# Patient Record
Sex: Male | Born: 1960 | Race: Black or African American | Hispanic: No | Marital: Married | Smoking: Never smoker
Health system: Southern US, Community
[De-identification: ages and names within clinical notes are randomized; demographics above are authoritative.]

## PROBLEM LIST (undated history)

## (undated) DIAGNOSIS — I10 Essential (primary) hypertension: Secondary | ICD-10-CM

## (undated) DIAGNOSIS — E785 Hyperlipidemia, unspecified: Secondary | ICD-10-CM

## (undated) DIAGNOSIS — Z973 Presence of spectacles and contact lenses: Secondary | ICD-10-CM

## (undated) DIAGNOSIS — C61 Malignant neoplasm of prostate: Secondary | ICD-10-CM

## (undated) HISTORY — DX: Hyperlipidemia, unspecified: E78.5

## (undated) HISTORY — PX: NO PAST SURGERIES: SHX2092

## (undated) HISTORY — DX: Essential (primary) hypertension: I10

## (undated) HISTORY — PX: PROSTATE BIOPSY: SHX241

---

## 1998-06-20 ENCOUNTER — Emergency Department (HOSPITAL_COMMUNITY): Admission: EM | Admit: 1998-06-20 | Discharge: 1998-06-20 | Payer: Self-pay | Admitting: Emergency Medicine

## 1998-06-24 ENCOUNTER — Encounter: Admission: RE | Admit: 1998-06-24 | Discharge: 1998-09-22 | Payer: Self-pay | Admitting: Internal Medicine

## 2018-04-13 ENCOUNTER — Ambulatory Visit: Payer: Managed Care, Other (non HMO) | Admitting: Family Medicine

## 2018-04-13 ENCOUNTER — Encounter: Payer: Self-pay | Admitting: Family Medicine

## 2018-04-13 VITALS — BP 142/92 | HR 61 | Temp 98.7°F | Ht 68.5 in | Wt 187.4 lb

## 2018-04-13 DIAGNOSIS — R350 Frequency of micturition: Secondary | ICD-10-CM

## 2018-04-13 DIAGNOSIS — Z23 Encounter for immunization: Secondary | ICD-10-CM

## 2018-04-13 DIAGNOSIS — R030 Elevated blood-pressure reading, without diagnosis of hypertension: Secondary | ICD-10-CM | POA: Diagnosis not present

## 2018-04-13 NOTE — Addendum Note (Signed)
Addended by: Sharon Seller B on: 04/13/2018 08:17 AM   Modules accepted: Orders

## 2018-04-13 NOTE — Progress Notes (Signed)
Pre visit review using our clinic review tool, if applicable. No additional management support is needed unless otherwise documented below in the visit note. 

## 2018-04-13 NOTE — Progress Notes (Signed)
Chief Complaint  Patient presents with  . New Patient (Initial Visit)       New Patient Visit SUBJECTIVE: HPI: Danny Vincent is an 57 y.o.male who is being seen for establishing care.  The patient has not had PCP in many years.  10 d ago had freq urination. Was going every hour and then every 15 min. This lasted for around 2-3 days and then got back to normal. He believes he may have had too much [place] Ginseng and stopped taking it. No unintentional wt loss, abd pain, hematuria, dysuria, fevers, or constipation. +Famhx of prostate cancer. He was drinking some coffee when this happened and has cut down on it.   Does not remember last tetanus shot.  Has never been screened for Hep C or HIV.    No Known Allergies  Past Medical History:  Diagnosis Date  . Hypertension    Past Surgical History:  Procedure Laterality Date  . NO PAST SURGERIES     Family History  Problem Relation Age of Onset  . Diabetes Mother   . Hypertension Mother   . Diabetes Father   . Cancer Father 102       prostate cancer   No Known Allergies  Takes no meds routinely.  ROS GI: Denies constipation or abd pain  Respiratory: Denies dyspnea   OBJECTIVE: BP (!) 142/92 (BP Location: Left Arm, Patient Position: Sitting, Cuff Size: Large)   Pulse 61   Temp 98.7 F (37.1 C) (Oral)   Ht 5' 8.5" (1.74 m)   Wt 187 lb 6 oz (85 kg)   BMI 28.08 kg/m   Constitutional: -  VS reviewed -  Well developed, well nourished, appears stated age -  No apparent distress  Psychiatric: -  Oriented to person, place, and time -  Memory intact -  Affect and mood normal -  Fluent conversation, good eye contact -  Judgment and insight age appropriate  Eye: -  Conjunctivae clear, no discharge -  Pupils symmetric, round, reactive to light  ENMT: -  MMM    Pharynx moist, no exudate, no erythema  Neck: -  No gross swelling, no palpable masses -  Thyroid midline, not enlarged, mobile, no palpable masses   Cardiovascular: -  RRR -  No LE edema  Respiratory: -  Normal respiratory effort, no accessory muscle use, no retraction -  Breath sounds equal, no wheezes, no ronchi, no crackles  Gastrointestinal: -  Bowel sounds normal -  No tenderness, no distention, no guarding, no masses  Neurological:  -  CN II - XII grossly intact -  Sensation grossly intact to light touch, equal bilaterally  Musculoskeletal: -  No clubbing, no cyanosis -  Gait normal  Skin: -  No significant lesion on inspection -  Warm and dry to palpation   ASSESSMENT/PLAN: Urinary frequency  Elevated blood pressure reading  Offered labs, declined at this time since he is not currently having s/s's. Mind fluid, caffeine, alcohol intake. Let us know if new s/s's or current ones arise/return. Ck BP's at home. Counseled on diet and exercise. Bring readings and monitor to next visit. Patient should return in 1 mo for CPE/HTN check. The patient voiced understanding and agreement to the plan.   [place], DO 04/13/18  7:49 AM

## 2018-04-13 NOTE — Patient Instructions (Addendum)
Mind the caffeine and alcohol intake.   I think it is OK to go back on ginseng if you want.   If things come back sooner, let us know.  Come to your next appointment fasting.  Check your blood pressure 3 times per week and write them down. Bring your monitor and your readings to your appointment.  Stay active.  Keep the diet clean.  Let us know if you need anything.

## 2018-05-19 ENCOUNTER — Encounter: Payer: Self-pay | Admitting: Family Medicine

## 2018-05-19 ENCOUNTER — Ambulatory Visit (INDEPENDENT_AMBULATORY_CARE_PROVIDER_SITE_OTHER): Payer: Managed Care, Other (non HMO) | Admitting: Family Medicine

## 2018-05-19 VITALS — BP 144/100 | HR 63 | Temp 98.4°F | Ht 69.0 in | Wt 187.1 lb

## 2018-05-19 DIAGNOSIS — Z0000 Encounter for general adult medical examination without abnormal findings: Secondary | ICD-10-CM

## 2018-05-19 DIAGNOSIS — I10 Essential (primary) hypertension: Secondary | ICD-10-CM | POA: Insufficient documentation

## 2018-05-19 DIAGNOSIS — Z23 Encounter for immunization: Secondary | ICD-10-CM

## 2018-05-19 DIAGNOSIS — Z125 Encounter for screening for malignant neoplasm of prostate: Secondary | ICD-10-CM

## 2018-05-19 DIAGNOSIS — Z114 Encounter for screening for human immunodeficiency virus [HIV]: Secondary | ICD-10-CM | POA: Diagnosis not present

## 2018-05-19 DIAGNOSIS — Z1211 Encounter for screening for malignant neoplasm of colon: Secondary | ICD-10-CM

## 2018-05-19 DIAGNOSIS — Z1159 Encounter for screening for other viral diseases: Secondary | ICD-10-CM

## 2018-05-19 LAB — COMPREHENSIVE METABOLIC PANEL
ALT: 13 U/L (ref 0–53)
AST: 14 U/L (ref 0–37)
Albumin: 4.3 g/dL (ref 3.5–5.2)
Alkaline Phosphatase: 72 U/L (ref 39–117)
BUN: 12 mg/dL (ref 6–23)
CO2: 31 mEq/L (ref 19–32)
Calcium: 9.6 mg/dL (ref 8.4–10.5)
Chloride: 104 mEq/L (ref 96–112)
Creatinine, Ser: 1.04 mg/dL (ref 0.40–1.50)
GFR: 95.24 mL/min (ref >60.00)
GLUCOSE: 124 mg/dL — AB (ref 70–99)
POTASSIUM: 3.9 meq/L (ref 3.5–5.1)
SODIUM: 143 meq/L (ref 135–145)
TOTAL PROTEIN: 7.3 g/dL (ref 6.0–8.3)
Total Bilirubin: 1 mg/dL (ref 0.2–1.2)

## 2018-05-19 LAB — LIPID PANEL
CHOLESTEROL: 192 mg/dL (ref 0–200)
HDL: 48 mg/dL (ref >39.00)
LDL Cholesterol: 128 mg/dL — ABNORMAL HIGH (ref 0–99)
NonHDL: 144.45
Total CHOL/HDL Ratio: 4
Triglycerides: 80 mg/dL (ref 0.0–149.0)
VLDL: 16 mg/dL (ref 0.0–40.0)

## 2018-05-19 LAB — PSA: PSA: 6.42 ng/mL — AB (ref 0.10–4.00)

## 2018-05-19 MED ORDER — VALSARTAN 160 MG PO TABS: 160.0000 mg | ORAL_TABLET | Freq: Every day | ORAL | 3 refills | Status: DC

## 2018-05-19 NOTE — Progress Notes (Signed)
Pre visit review using our clinic review tool, if applicable. No additional management support is needed unless otherwise documented below in the visit note. 

## 2018-05-19 NOTE — Patient Instructions (Addendum)
Continue checking BP at home.  Keep the diet clean and stay active.  Clotrimazole cream over the counter, twice daily for 2 weeks.  Give [place] 2-3 business days to get the results of your labs back.  If you do not hear anything about your referral in the next 1-2 weeks, call our office and ask for an update.  Let us know if you need anything.   DASH Eating Plan DASH stands for "Dietary Approaches to Stop Hypertension." The DASH eating plan is a healthy eating plan that has been shown to reduce high blood pressure (hypertension). It may also reduce your risk for type 2 diabetes, heart disease, and stroke. The DASH eating plan may also help with weight loss. What are tips for following this plan? General guidelines  Avoid eating more than 2,300 mg (milligrams) of salt (sodium) a day. If you have hypertension, you may need to reduce your sodium intake to 1,500 mg a day.  Limit alcohol intake to no more than 1 drink a day for nonpregnant women and 2 drinks a day for men. One drink equals 12 oz of beer, 5 oz of wine, or 1 oz of hard liquor.  Work with your health care provider to maintain a healthy body weight or to lose weight. Ask what an ideal weight is for you.  Get at least 30 minutes of exercise that causes your heart to beat faster (aerobic exercise) most days of the week. Activities may include walking, swimming, or biking.  Work with your health care provider or diet and nutrition specialist (dietitian) to adjust your eating plan to your individual calorie needs. Reading food labels  Check food labels for the amount of sodium per serving. Choose foods with less than 5 percent of the Daily Value of sodium. Generally, foods with less than 300 mg of sodium per serving fit into this eating plan.  To find whole grains, look for the word "whole" as the first word in the ingredient list. Shopping  Buy products labeled as "low-sodium" or "no salt added."  Buy fresh foods. Avoid canned  foods and premade or frozen meals. Cooking  Avoid adding salt when cooking. Use salt-free seasonings or herbs instead of table salt or sea salt. Check with your health care provider or pharmacist before using salt substitutes.  Do not fry foods. Cook foods using healthy methods such as baking, boiling, grilling, and broiling instead.  Cook with heart-healthy oils, such as olive, canola, soybean, or sunflower oil. Meal planning   Eat a balanced diet that includes: ? 5 or more servings of fruits and vegetables each day. At each meal, try to fill half of your plate with fruits and vegetables. ? Up to 6-8 servings of whole grains each day. ? Less than 6 oz of lean meat, poultry, or fish each day. A 3-oz serving of meat is about the same size as a deck of cards. One egg equals 1 oz. ? 2 servings of low-fat dairy each day. ? A serving of nuts, seeds, or beans 5 times each week. ? Heart-healthy fats. Healthy fats called Omega-3 fatty acids are found in foods such as flaxseeds and coldwater fish, like sardines, salmon, and mackerel.  Limit how much you eat of the following: ? Canned or prepackaged foods. ? Food that is high in trans fat, such as fried foods. ? Food that is high in saturated fat, such as fatty meat. ? Sweets, desserts, sugary drinks, and other foods with added sugar. ? Full-fat  dairy products.  Do not salt foods before eating.  Try to eat at least 2 vegetarian meals each week.  Eat more home-cooked food and less restaurant, buffet, and fast food.  When eating at a restaurant, ask that your food be prepared with less salt or no salt, if possible. What foods are recommended? The items listed may not be a complete list. Talk with your dietitian about what dietary choices are best for you. Grains Whole-grain or whole-wheat bread. Whole-grain or whole-wheat pasta. Brown rice. [place]. Bulgur. Whole-grain and low-sodium cereals. Pita bread. Low-fat, low-sodium crackers.  Whole-wheat flour tortillas. Vegetables Fresh or frozen vegetables (raw, steamed, roasted, or grilled). Low-sodium or reduced-sodium tomato and vegetable juice. Low-sodium or reduced-sodium tomato sauce and tomato paste. Low-sodium or reduced-sodium canned vegetables. Fruits All fresh, dried, or frozen fruit. Canned fruit in natural juice (without added sugar). Meat and other protein foods Skinless chicken or [place]. Ground chicken or [place]. Pork with fat trimmed off. Fish and seafood. Egg whites. Dried beans, peas, or lentils. Unsalted nuts, nut butters, and seeds. Unsalted canned beans. Lean cuts of beef with fat trimmed off. Low-sodium, lean deli meat. Dairy Low-fat (1%) or fat-free (skim) milk. Fat-free, low-fat, or reduced-fat cheeses. Nonfat, low-sodium ricotta or cottage cheese. Low-fat or nonfat yogurt. Low-fat, low-sodium cheese. Fats and oils Soft margarine without trans fats. Vegetable oil. Low-fat, reduced-fat, or light mayonnaise and salad dressings (reduced-sodium). Canola, safflower, olive, soybean, and sunflower oils. Avocado. Seasoning and other foods Herbs. Spices. Seasoning mixes without salt. Unsalted popcorn and pretzels. Fat-free sweets. What foods are not recommended? The items listed may not be a complete list. Talk with your dietitian about what dietary choices are best for you. Grains Baked goods made with fat, such as croissants, muffins, or some breads. Dry pasta or rice meal packs. Vegetables Creamed or fried vegetables. Vegetables in a cheese sauce. Regular canned vegetables (not low-sodium or reduced-sodium). Regular canned tomato sauce and paste (not low-sodium or reduced-sodium). Regular tomato and vegetable juice (not low-sodium or reduced-sodium). Angie Fava. Olives. Fruits Canned fruit in a light or heavy syrup. Fried fruit. Fruit in cream or butter sauce. Meat and other protein foods Fatty cuts of meat. Ribs. Fried meat. Berniece Salines. Sausage. Bologna and other  processed lunch meats. Salami. Fatback. Hotdogs. Bratwurst. Salted nuts and seeds. Canned beans with added salt. Canned or smoked fish. Whole eggs or egg yolks. Chicken or [place] with skin. Dairy Whole or 2% milk, cream, and half-and-half. Whole or full-fat cream cheese. Whole-fat or sweetened yogurt. Full-fat cheese. Nondairy creamers. Whipped toppings. Processed cheese and cheese spreads. Fats and oils Butter. Stick margarine. Lard. Shortening. Ghee. Bacon fat. Tropical oils, such as coconut, palm kernel, or palm oil. Seasoning and other foods Salted popcorn and pretzels. Onion salt, garlic salt, seasoned salt, table salt, and sea salt. Worcestershire sauce. Tartar sauce. Barbecue sauce. Teriyaki sauce. Soy sauce, including reduced-sodium. Steak sauce. Canned and packaged gravies. Fish sauce. Oyster sauce. Cocktail sauce. Horseradish that you find on the shelf. Ketchup. Mustard. Meat flavorings and tenderizers. Bouillon cubes. Hot sauce and Tabasco sauce. Premade or packaged marinades. Premade or packaged taco seasonings. Relishes. Regular salad dressings. Where to find more information:  National Heart, Lung, and [place]: https://wilson-eaton.com/  American Heart Association: www.heart.org Summary  The DASH eating plan is a healthy eating plan that has been shown to reduce high blood pressure (hypertension). It may also reduce your risk for type 2 diabetes, heart disease, and stroke.  With the DASH eating plan, you should  limit salt (sodium) intake to 2,300 mg a day. If you have hypertension, you may need to reduce your sodium intake to 1,500 mg a day.  When on the DASH eating plan, aim to eat more fresh fruits and vegetables, whole grains, lean proteins, low-fat dairy, and heart-healthy fats.  Work with your health care provider or diet and nutrition specialist (dietitian) to adjust your eating plan to your individual calorie needs. This information is not intended to replace advice given to  you by your health care provider. Make sure you discuss any questions you have with your health care provider. Document Released: 08/27/2011 Document Revised: 08/31/2016 Document Reviewed: 08/31/2016 Elsevier Interactive Patient Education  [employer].

## 2018-05-19 NOTE — Addendum Note (Signed)
Addended by: Sharon Seller B on: 05/19/2018 08:49 AM   Modules accepted: Orders

## 2018-05-19 NOTE — Progress Notes (Signed)
Chief Complaint  Patient presents with  . Annual Exam    Well Male Danny Vincent is here for a complete physical.   His last physical was >1 year ago.  Current diet: in general, a "not really healthy" diet.  Current exercise: physically active at work; [employer] 3x/week lifting wts Weight trend: stable Seat belt? Yes.     Health maintenance Shingrix- No Colonoscopy- No Tetanus- Yes HIV- No Hep C- No Prostate cancer screening- No   Past Medical History:  Diagnosis Date  . Hypertension       Past Surgical History:  Procedure Laterality Date  . NO PAST SURGERIES      Medications  Takes no meds routinely.   Allergies No Known Allergies  Family History Family History  Problem Relation Age of Onset  . Diabetes Mother   . Hypertension Mother   . Diabetes Father   . Cancer Father 56       prostate cancer    Review of Systems: Constitutional:  no fevers Eye:  no recent significant change in vision Ear/Nose/Mouth/Throat:  Ears:  no hearing loss Nose/Mouth/Throat:  no complaints of nasal congestion, no sore throat Cardiovascular:  no chest pain, no palpitations Respiratory:  no cough and no shortness of breath Gastrointestinal:  no abdominal pain, no change in bowel habits GU:  Male: negative for dysuria, frequency, and incontinence and negative for prostate symptoms Musculoskeletal/Extremities:  no pain, redness, or swelling of the joints Integumentary (Skin/Breast):  no abnormal skin lesions reported Neurologic:  no headaches Endocrine: No unexpected weight changes Hematologic/Lymphatic:  no abnormal bleeding  Exam BP (!) 144/100 (BP Location: Left Arm, Patient Position: Sitting, Cuff Size: Normal)   Pulse 63   Temp 98.4 F (36.9 C) (Oral)   Ht 5\' 9"  (1.753 m)   Wt 187 lb 2 oz (84.9 kg)   SpO2 98%   BMI 27.63 kg/m  General:  well developed, well nourished, in no apparent distress Skin: Hyperpigmentation with central clearing in inguinal  region; otherwise no significant moles, warts, or growths Head:  no masses, lesions, or tenderness Eyes:  pupils equal and round, sclera anicteric without injection Ears:  canals without lesions, TMs shiny without retraction, no obvious effusion, no erythema Nose:  nares patent, septum midline, mucosa normal Throat/Pharynx:  lips and gingiva without lesion; tongue and uvula midline; non-inflamed pharynx; no exudates or postnasal drainage Neck: neck supple without adenopathy, thyromegaly, or masses Lungs:  clear to auscultation, breath sounds equal bilaterally, no respiratory distress Cardio:  regular rate and rhythm, no LE edema, no bruits Abdomen:  abdomen soft, nontender; bowel sounds normal; no masses or organomegaly Genital (male): circumcised penis, no lesions or discharge; testes present bilaterally without masses or tenderness Rectal: Deferred Musculoskeletal:  symmetrical muscle groups noted without atrophy or deformity Extremities:  no clubbing, cyanosis, or edema, no deformities, no skin discoloration Neuro:  gait normal; deep tendon reflexes normal and symmetric Psych: well oriented with flat affect and appropriate judgment/insight  Assessment and Plan  Well adult exam - Plan: Comprehensive metabolic panel, Lipid panel  Screening for HIV (human immunodeficiency virus) - Plan: HIV antibody  Encounter for hepatitis C screening test for low risk patient - Plan: Hepatitis C antibody  Screening for prostate cancer - Plan: PSA  Screen for colon cancer - Plan: Ambulatory referral to Gastroenterology  Essential hypertension - Plan: valsartan (DIOVAN) 160 MG tablet   Well 57 y.o. male. Counseled on diet and exercise. Counseled on risks and benefits of prostate  cancer screening with PSA. The patient agrees to undergo testing. Immunizations, labs, and further orders as above. Start ARB, will recheck lytes/renal function at f/u. Cont checking BP at home.  OTC clotrimazole for jock  itch. Follow up 1 mo. The patient voiced understanding and agreement to the plan.  [place], DO 05/19/18 8:38 AM

## 2018-05-20 ENCOUNTER — Other Ambulatory Visit: Payer: Self-pay | Admitting: Family Medicine

## 2018-05-20 DIAGNOSIS — R739 Hyperglycemia, unspecified: Secondary | ICD-10-CM

## 2018-05-20 DIAGNOSIS — R972 Elevated prostate specific antigen [PSA]: Secondary | ICD-10-CM

## 2018-05-20 LAB — HIV ANTIBODY (ROUTINE TESTING W REFLEX): HIV 1&2 Ab, 4th Generation: NONREACTIVE

## 2018-05-20 LAB — HEPATITIS C ANTIBODY
Hepatitis C Ab: NONREACTIVE
SIGNAL TO CUT-OFF: 0.03 (ref <1.00)

## 2018-06-20 ENCOUNTER — Encounter: Payer: Self-pay | Admitting: Family Medicine

## 2018-06-23 ENCOUNTER — Ambulatory Visit: Payer: Managed Care, Other (non HMO) | Admitting: Family Medicine

## 2018-06-23 ENCOUNTER — Encounter: Payer: Self-pay | Admitting: Family Medicine

## 2018-06-23 VITALS — BP 140/86 | HR 50 | Temp 98.3°F | Ht 69.0 in | Wt 187.2 lb

## 2018-06-23 DIAGNOSIS — I10 Essential (primary) hypertension: Secondary | ICD-10-CM

## 2018-06-23 MED ORDER — VALSARTAN 320 MG PO TABS: 320.0000 mg | ORAL_TABLET | Freq: Every day | ORAL | 2 refills | Status: DC

## 2018-06-23 MED ORDER — VALSARTAN 160 MG PO TABS: 160.0000 mg | ORAL_TABLET | Freq: Every day | ORAL | 3 refills | Status: DC

## 2018-06-23 NOTE — Patient Instructions (Addendum)
Keep checking blood pressures.   Keep the diet clean.  I love that you are adding cardio to your regimen.   I think we are very close to being where we want to be.   Call the gastroenterologist to get set up with your colonoscopy.  Let us know if you need anything.

## 2018-06-23 NOTE — Progress Notes (Signed)
Pre visit review using our clinic review tool, if applicable. No additional management support is needed unless otherwise documented below in the visit note. 

## 2018-06-23 NOTE — Progress Notes (Signed)
Chief Complaint  Patient presents with  . Follow-up    Subjective Danny Vincent is a 57 y.o. male who presents for hypertension follow up. He does monitor home blood pressures. Blood pressures ranging from 140's/80's on average. He is compliant with medication- valsartan 160 mg/d. Patient has these side effects of medication: none He is adhering to a healthy diet overall. Current exercise: lift weights, active at work   Past Medical History:  Diagnosis Date  . Hypertension     Review of Systems Cardiovascular: no chest pain Respiratory:  no shortness of breath  Exam BP 140/86 (BP Location: Left Arm, Patient Position: Sitting, Cuff Size: Normal)   Pulse (!) 50   Temp 98.3 F (36.8 C) (Oral)   Ht 5\' 9"  (1.753 m)   Wt 187 lb 4 oz (84.9 kg)   SpO2 97%   BMI 27.65 kg/m  General:  well developed, well nourished, in no apparent distress Heart: RRR, no bruits, no LE edema Lungs: clear to auscultation, no accessory muscle use Psych: well oriented with normal range of affect and appropriate judgment/insight  Essential hypertension - Plan: valsartan (DIOVAN) 320 MG tablet  Increase dose of Valsartan from 160 mg/d to 320 mg/d. Cont ck'ing BP at home. Counseled on diet and exercise. F/u in 1 mo. The patient voiced understanding and agreement to the plan.  [place], DO 06/23/18  8:32 AM

## 2018-07-25 ENCOUNTER — Ambulatory Visit: Payer: Managed Care, Other (non HMO) | Admitting: Family Medicine

## 2018-08-05 ENCOUNTER — Encounter: Payer: Self-pay | Admitting: Family Medicine

## 2018-08-05 ENCOUNTER — Ambulatory Visit: Payer: Managed Care, Other (non HMO) | Admitting: Family Medicine

## 2018-08-05 VITALS — BP 152/100 | HR 60 | Temp 98.7°F | Ht 69.0 in | Wt 190.0 lb

## 2018-08-05 DIAGNOSIS — I10 Essential (primary) hypertension: Secondary | ICD-10-CM

## 2018-08-05 MED ORDER — AMLODIPINE BESYLATE-VALSARTAN 5-320 MG PO TABS: 1.0000 | ORAL_TABLET | Freq: Every day | ORAL | 2 refills | Status: DC

## 2018-08-05 NOTE — Progress Notes (Signed)
Chief Complaint  Patient presents with  . Follow-up    Subjective Danny Vincent is a 57 y.o. male who presents for hypertension follow up. He does monitor home blood pressures. Blood pressures ranging from 150's/100's on average. He is compliant with medications. Patient has these side effects of medication: none He is adhering to a healthy diet overall. Current exercise: none   Past Medical History:  Diagnosis Date  . Hypertension     Review of Systems Cardiovascular: no chest pain Respiratory:  no shortness of breath  Exam BP (!) 152/100 (BP Location: Left Arm, Patient Position: Sitting, Cuff Size: Normal)   Pulse 60   Temp 98.7 F (37.1 C) (Oral)   Ht 5\' 9"  (1.753 m)   Wt 190 lb (86.2 kg)   SpO2 96%   BMI 28.06 kg/m  General:  well developed, well nourished, in no apparent distress Heart: RRR, no bruits, no LE edema Lungs: clear to auscultation, no accessory muscle use Psych: well oriented with normal range of affect and appropriate judgment/insight  Essential hypertension - Plan: amLODipine-valsartan (EXFORGE) 5-320 MG tablet  Orders as above. Add Norvasc to Valsartan. Counseled on diet and exercise. Cont ck'ing BP at home. F/u in 1 mo. The patient voiced understanding and agreement to the plan.  [place], DO 08/05/18  8:58 AM

## 2018-08-05 NOTE — Progress Notes (Signed)
Pre visit review using our clinic review tool, if applicable. No additional management support is needed unless otherwise documented below in the visit note. 

## 2018-08-05 NOTE — Patient Instructions (Addendum)
Continue checking your blood pressure at home.  Aim to do some physical exertion for 150 minutes per week. This is typically divided into 5 days per week, 30 minutes per day. The activity should be enough to get your heart rate up. Anything is better than nothing if you have time constraints.  Keep the diet clean.   Let me know if the medicine is too expensive. I can call them in separately at an affordable cost.  Let us know if you need anything.

## 2018-09-02 ENCOUNTER — Ambulatory Visit: Payer: Managed Care, Other (non HMO) | Admitting: Family Medicine

## 2018-09-05 ENCOUNTER — Encounter: Payer: Self-pay | Admitting: Family Medicine

## 2018-09-05 ENCOUNTER — Ambulatory Visit: Payer: Managed Care, Other (non HMO) | Admitting: Family Medicine

## 2018-09-05 VITALS — BP 130/80 | HR 60 | Temp 98.4°F | Ht 69.0 in | Wt 194.2 lb

## 2018-09-05 DIAGNOSIS — I10 Essential (primary) hypertension: Secondary | ICD-10-CM | POA: Diagnosis not present

## 2018-09-05 NOTE — Patient Instructions (Signed)
Call the GI team to set up your colonoscopy.  Because your blood pressure is well-controlled, you no longer have to check your blood pressure at home anymore unless you wish. Some people check it twice daily every day and some people stop altogether. Either or anything in between is fine. Strong work!  Keep the diet clean and stay active.  Aim to do some physical exertion for 150 minutes per week. This is typically divided into 5 days per week, 30 minutes per day. The activity should be enough to get your heart rate up. Anything is better than nothing if you have time constraints.  Let us know if you need anything.

## 2018-09-05 NOTE — Progress Notes (Signed)
Chief Complaint  Patient presents with  . Follow-up    hypertension    Subjective Danny Vincent is a 57 y.o. male who presents for hypertension follow up. He does monitor home blood pressures. Blood pressures ranging from 130's/80's on average. He is compliant with medications- Exforge 5-320 mg/d. Patient has these side effects of medication: none He is adhering to a healthy diet overall. Current exercise: none   Past Medical History:  Diagnosis Date  . Hypertension     Review of Systems Cardiovascular: no chest pain Respiratory:  no shortness of breath  Exam BP 130/80 (BP Location: Left Arm, Patient Position: Sitting, Cuff Size: Normal)   Pulse 60   Temp 98.4 F (36.9 C) (Oral)   Ht 5\' 9"  (1.753 m)   Wt 194 lb 4 oz (88.1 kg)   SpO2 97%   BMI 28.69 kg/m  General:  well developed, well nourished, in no apparent distress Heart: RRR, no bruits, no LE edema Lungs: clear to auscultation, no accessory muscle use Psych: well oriented with normal range of affect and appropriate judgment/insight  Essential hypertension  Cont Exforge 5-320. OK to stop ck'ing BP at home.  Counseled on diet and exercise. Start exercising. He was encouraged to call GI office for his colonoscopy. F/u in 3 mo, if still controlled every 6 mo after that. The patient voiced understanding and agreement to the plan.  [place], DO 09/05/18  7:13 AM

## 2018-11-04 ENCOUNTER — Other Ambulatory Visit: Payer: Self-pay | Admitting: Family Medicine

## 2018-11-04 DIAGNOSIS — I10 Essential (primary) hypertension: Secondary | ICD-10-CM

## 2018-12-05 ENCOUNTER — Ambulatory Visit: Payer: Managed Care, Other (non HMO) | Admitting: Family Medicine

## 2018-12-19 ENCOUNTER — Ambulatory Visit: Payer: Managed Care, Other (non HMO) | Admitting: Family Medicine

## 2019-02-07 ENCOUNTER — Other Ambulatory Visit: Payer: Self-pay | Admitting: Family Medicine

## 2019-02-07 DIAGNOSIS — I10 Essential (primary) hypertension: Secondary | ICD-10-CM

## 2019-05-09 ENCOUNTER — Other Ambulatory Visit: Payer: Self-pay | Admitting: Family Medicine

## 2019-05-09 DIAGNOSIS — I10 Essential (primary) hypertension: Secondary | ICD-10-CM

## 2019-05-09 MED ORDER — AMLODIPINE BESYLATE-VALSARTAN 5-320 MG PO TABS: 1.0000 | ORAL_TABLET | Freq: Every day | ORAL | 5 refills | Status: DC

## 2020-03-16 ENCOUNTER — Ambulatory Visit: Payer: Managed Care, Other (non HMO) | Attending: Internal Medicine

## 2020-03-16 DIAGNOSIS — Z23 Encounter for immunization: Secondary | ICD-10-CM

## 2020-03-16 NOTE — Progress Notes (Signed)
   Covid-19 Vaccination Clinic  Name:  Danny Vincent    MRN: [id number] DOB: Dec 19, 1960  03/16/2020  [name] was observed post Covid-19 immunization for 15 minutes without incident. He was provided with Vaccine Information Sheet and instruction to access the V-Safe system.   [name] was instructed to call 911 with any severe reactions post vaccine: Marland Kitchen Difficulty breathing  . Swelling of face and throat  . A fast heartbeat  . A bad rash all over body  . Dizziness and weakness   Immunizations Administered    Name Date Dose VIS Date Route   Pfizer COVID-19 Vaccine 03/16/2020  8:55 AM 0.3 mL 11/15/2018 Intramuscular   Manufacturer: [place]   Lot: [id number]   [place]: [id number]

## 2020-03-29 ENCOUNTER — Ambulatory Visit (INDEPENDENT_AMBULATORY_CARE_PROVIDER_SITE_OTHER): Payer: Managed Care, Other (non HMO) | Admitting: Family Medicine

## 2020-03-29 ENCOUNTER — Other Ambulatory Visit (INDEPENDENT_AMBULATORY_CARE_PROVIDER_SITE_OTHER): Payer: Managed Care, Other (non HMO)

## 2020-03-29 ENCOUNTER — Other Ambulatory Visit: Payer: Self-pay

## 2020-03-29 ENCOUNTER — Encounter: Payer: Self-pay | Admitting: Family Medicine

## 2020-03-29 ENCOUNTER — Other Ambulatory Visit: Payer: Self-pay | Admitting: Family Medicine

## 2020-03-29 VITALS — BP 138/86 | HR 53 | Temp 98.2°F | Ht 68.5 in | Wt 199.2 lb

## 2020-03-29 DIAGNOSIS — I10 Essential (primary) hypertension: Secondary | ICD-10-CM

## 2020-03-29 DIAGNOSIS — Z125 Encounter for screening for malignant neoplasm of prostate: Secondary | ICD-10-CM | POA: Diagnosis not present

## 2020-03-29 DIAGNOSIS — R739 Hyperglycemia, unspecified: Secondary | ICD-10-CM

## 2020-03-29 DIAGNOSIS — Z0000 Encounter for general adult medical examination without abnormal findings: Secondary | ICD-10-CM | POA: Diagnosis not present

## 2020-03-29 DIAGNOSIS — R972 Elevated prostate specific antigen [PSA]: Secondary | ICD-10-CM

## 2020-03-29 DIAGNOSIS — Z1211 Encounter for screening for malignant neoplasm of colon: Secondary | ICD-10-CM | POA: Diagnosis not present

## 2020-03-29 LAB — CBC
HCT: 42.4 % (ref 39.0–52.0)
Hemoglobin: 13.5 g/dL (ref 13.0–17.0)
MCHC: 31.8 g/dL (ref 30.0–36.0)
MCV: 83.8 fl (ref 78.0–100.0)
Platelets: 291 10*3/uL (ref 150.0–400.0)
RBC: 5.05 Mil/uL (ref 4.22–5.81)
RDW: 15.3 % (ref 11.5–15.5)
WBC: 8.6 10*3/uL (ref 4.0–10.5)

## 2020-03-29 LAB — COMPREHENSIVE METABOLIC PANEL
ALT: 17 U/L (ref 0–53)
AST: 16 U/L (ref 0–37)
Albumin: 4.6 g/dL (ref 3.5–5.2)
Alkaline Phosphatase: 78 U/L (ref 39–117)
BUN: 13 mg/dL (ref 6–23)
CO2: 28 mEq/L (ref 19–32)
Calcium: 9.7 mg/dL (ref 8.4–10.5)
Chloride: 105 mEq/L (ref 96–112)
Creatinine, Ser: 1.07 mg/dL (ref 0.40–1.50)
GFR: 86.14 mL/min (ref >60.00)
Glucose, Bld: 139 mg/dL — ABNORMAL HIGH (ref 70–99)
Potassium: 4 mEq/L (ref 3.5–5.1)
Sodium: 142 mEq/L (ref 135–145)
Total Bilirubin: 0.8 mg/dL (ref 0.2–1.2)
Total Protein: 7.3 g/dL (ref 6.0–8.3)

## 2020-03-29 LAB — HEMOGLOBIN A1C: Hgb A1c MFr Bld: 6.5 % (ref 4.6–6.5)

## 2020-03-29 LAB — LIPID PANEL
Cholesterol: 203 mg/dL — ABNORMAL HIGH (ref 0–200)
HDL: 45.2 mg/dL (ref >39.00)
LDL Cholesterol: 138 mg/dL — ABNORMAL HIGH (ref 0–99)
NonHDL: 157.94
Total CHOL/HDL Ratio: 4
Triglycerides: 101 mg/dL (ref 0.0–149.0)
VLDL: 20.2 mg/dL (ref 0.0–40.0)

## 2020-03-29 LAB — PSA: PSA: 8.78 ng/mL — ABNORMAL HIGH (ref 0.10–4.00)

## 2020-03-29 MED ORDER — AMLODIPINE BESYLATE-VALSARTAN 5-320 MG PO TABS: 1.0000 | ORAL_TABLET | Freq: Every day | ORAL | 2 refills | Status: DC

## 2020-03-29 NOTE — Patient Instructions (Addendum)
Give [place] 2-3 business days to get the results of your labs back.   Keep the diet clean and stay active.  OK to use Debrox (peroxide) in the ear to loosen up wax. Also recommend using a bulb syringe (for removing boogers from baby's noses) to flush through warm water. Do not use Q-tips as this can impact wax further.  If you do not hear anything about your referral in the next 1-2 weeks, call our office and ask for an update.  Let us know if you need anything.

## 2020-03-29 NOTE — Progress Notes (Signed)
Chief Complaint  Patient presents with   Annual Exam    Well Male Danny Vincent is here for a complete physical.   His last physical was >1 year ago.  Current diet: in general, diet could be better.  Current exercise: cycling, walking Weight trend: stable Fatigue? No. Seat belt? Yes.    Health maintenance Shingrix- Needs 2nd dose Colonoscopy- No Tetanus- Yes HIV- Yes Hep C- Yes   Past Medical History:  Diagnosis Date   Hypertension       Past Surgical History:  Procedure Laterality Date   NO PAST SURGERIES      Medications  No current outpatient medications on file prior to visit.   No current facility-administered medications on file prior to visit.     Allergies No Known Allergies  Family History Family History  Problem Relation Age of Onset   Diabetes Mother    Hypertension Mother    Diabetes Father    Cancer Father 43       prostate cancer    Review of Systems: Constitutional:  no fevers Eye:  no recent significant change in vision Ear/Nose/Mouth/Throat:  Ears:  no hearing loss Nose/Mouth/Throat:  no complaints of nasal congestion, no sore throat Cardiovascular:  no chest pain, no palpitations Respiratory:  no cough and no shortness of breath Gastrointestinal:  no abdominal pain, no change in bowel habits GU:  Male: negative for dysuria, frequency, and incontinence and negative for prostate symptoms Musculoskeletal/Extremities:  no pain, redness, or swelling of the joints Integumentary (Skin/Breast):  no abnormal skin lesions reported Neurologic:  no headaches Endocrine: No unexpected weight changes Hematologic/Lymphatic:  no abnormal bleeding  Exam BP 138/86 (BP Location: Left Arm, Patient Position: Sitting, Cuff Size: Normal)    Pulse (!) 53    Temp 98.2 F (36.8 C) (Oral)    Ht 5' 8.5" (1.74 m)    Wt 199 lb 4 oz (90.4 kg)    SpO2 99%    BMI 29.86 kg/m  General:  well developed, well nourished, in no apparent distress Skin:  no  significant moles, warts, or growths Head:  no masses, lesions, or tenderness Eyes:  pupils equal and round, sclera anicteric without injection Ears:  canals without lesions, TMs shiny without retraction, no obvious effusion, no erythema Nose:  nares patent, septum midline, mucosa normal Throat/Pharynx:  lips and gingiva without lesion; tongue and uvula midline; non-inflamed pharynx; no exudates or postnasal drainage Neck: neck supple without adenopathy, thyromegaly, or masses Cardiac: RRR, no bruits, no LE edema Lungs:  clear to auscultation, breath sounds equal bilaterally, no respiratory distress Rectal: Deferred Musculoskeletal:  symmetrical muscle groups noted without atrophy or deformity Neuro:  gait normal; deep tendon reflexes normal and symmetric Psych: well oriented with normal range of affect and appropriate judgment/insight  Assessment and Plan  Essential hypertension - Plan: amLODipine-valsartan (EXFORGE) 5-320 MG tablet  Well adult exam - Plan: CBC, Comprehensive metabolic panel, Lipid panel  Screen for colon cancer - Plan: Ambulatory referral to Gastroenterology  Screening for malignant neoplasm of prostate - Plan: PSA   Well 59 y.o. male. Counseled on diet and exercise. Counseled on risks and benefits of prostate cancer screening with PSA. The patient agrees to undergo testing. Routine EKG for HTN. Home readings high, will restart Exforge.  Immunizations, labs, and further orders as above. Follow up in 1 mo for Shingrix #2, med ck in 6 mo. The patient voiced understanding and agreement to the plan.  [place], DO 03/29/20 10:46  AM

## 2020-04-06 ENCOUNTER — Ambulatory Visit: Payer: Managed Care, Other (non HMO) | Attending: Internal Medicine

## 2020-04-06 ENCOUNTER — Other Ambulatory Visit: Payer: Self-pay

## 2020-04-06 DIAGNOSIS — Z23 Encounter for immunization: Secondary | ICD-10-CM

## 2020-04-06 NOTE — Progress Notes (Signed)
   Covid-19 Vaccination Clinic  Name:  Danny Vincent    MRN: [id number] DOB: 1961/03/13  04/06/2020  [name] was observed post Covid-19 immunization for 15 minutes without incident. He was provided with Vaccine Information Sheet and instruction to access the V-Safe system.   [name] was instructed to call 911 with any severe reactions post vaccine: Marland Kitchen Difficulty breathing  . Swelling of face and throat  . A fast heartbeat  . A bad rash all over body  . Dizziness and weakness   Immunizations Administered    Name Date Dose VIS Date Route   Pfizer COVID-19 Vaccine 04/06/2020 10:06 AM 0.3 mL 11/15/2018 Intramuscular   Manufacturer: [employer], [employer]   Lot: [id number]   [place]: [id number]

## 2020-04-22 ENCOUNTER — Ambulatory Visit: Payer: Managed Care, Other (non HMO) | Admitting: Family Medicine

## 2020-04-22 ENCOUNTER — Encounter: Payer: Self-pay | Admitting: Family Medicine

## 2020-04-22 ENCOUNTER — Other Ambulatory Visit: Payer: Self-pay

## 2020-04-22 VITALS — BP 130/82 | HR 61 | Temp 98.7°F | Ht 68.5 in | Wt 197.5 lb

## 2020-04-22 DIAGNOSIS — Z23 Encounter for immunization: Secondary | ICD-10-CM | POA: Diagnosis not present

## 2020-04-22 DIAGNOSIS — E1165 Type 2 diabetes mellitus with hyperglycemia: Secondary | ICD-10-CM | POA: Diagnosis not present

## 2020-04-22 DIAGNOSIS — B353 Tinea pedis: Secondary | ICD-10-CM | POA: Diagnosis not present

## 2020-04-22 LAB — MICROALBUMIN / CREATININE URINE RATIO
Creatinine,U: 245.4 mg/dL
Microalb Creat Ratio: 0.3 mg/g (ref 0.0–30.0)
Microalb, Ur: 0.8 mg/dL (ref 0.0–1.9)

## 2020-04-22 MED ORDER — ROSUVASTATIN CALCIUM 20 MG PO TABS
20.0000 mg | ORAL_TABLET | Freq: Every day | ORAL | 3 refills | Status: DC
Start: 2020-04-22 — End: 2021-03-25

## 2020-04-22 MED ORDER — KETOCONAZOLE 2 % EX CREA: 1.0000 "application " | TOPICAL_CREAM | Freq: Every day | CUTANEOUS | 0 refills | Status: AC

## 2020-04-22 NOTE — Progress Notes (Signed)
Chief Complaint  Patient presents with  . Follow-up     Subjective: Patient is a 59 y.o. male here for f/u labs.  A1c 6.5 on 7/9. Diet has been "shaky". Eye exam 1 mo ago, no concerns. Not on statin, has not had PCV23.  No issues with his feet. +Family history of diabetes in both parents.  Past Medical History:  Diagnosis Date  . Hypertension     Objective: BP (!) 130/82 (BP Location: Right Arm, Patient Position: Sitting, Cuff Size: Normal)   Pulse 61   Temp 98.7 F (37.1 C) (Oral)   Ht 5' 8.5" (1.74 m)   Wt 197 lb 8 oz (89.6 kg)   SpO2 100%   BMI 29.59 kg/m  General: Awake, appears stated age Heart: RRR, 3+ DP pulses b/l Skin: Macerated tissue between 3/4 and 4/5th digits b/l; no external lesions noted otherwise. Lungs: No accessory muscle use Neuro: Sensation of feet intact to pinprick bilaterally Psych: Age appropriate judgment and insight, normal affect and mood  Assessment and Plan: Type 2 diabetes mellitus with hyperglycemia, without long-term current use of insulin ([place]) - Plan: Microalbumin / creatinine urine ratio  Tinea pedis of both feet  Need for vaccination against Streptococcus pneumoniae - Plan: Pneumococcal polysaccharide vaccine 23-valent greater than or equal to 2yo subcutaneous/IM  Need for shingles vaccine - Plan: Varicella-zoster vaccine IM (Shingrix)  1.  Start statin, he will find out what glucometer his insurance covers and we can send in.  He does not need to check his sugars routinely at this time though.  Counseled on diet and exercise.  He has an eye provider.  Immunizations updated today. 2.  Ketoconazole for 6 weeks. Follow-up in 6 months or as needed. The patient voiced understanding and agreement to the plan.  [place], DO 04/22/20  10:27 AM

## 2020-04-22 NOTE — Patient Instructions (Signed)
Keep the diet clean and stay active.  Call your insurance company to see what glucose meters they cover and let us know. We will send in.  Don't worry about checking your sugars at this time.   Let us know if you need anything.

## 2020-04-30 ENCOUNTER — Ambulatory Visit: Payer: Managed Care, Other (non HMO)

## 2020-05-16 ENCOUNTER — Encounter: Payer: Self-pay | Admitting: Gastroenterology

## 2020-06-21 ENCOUNTER — Other Ambulatory Visit: Payer: Self-pay

## 2020-06-21 ENCOUNTER — Ambulatory Visit (AMBULATORY_SURGERY_CENTER): Payer: Self-pay | Admitting: *Deleted

## 2020-06-21 ENCOUNTER — Encounter: Payer: Self-pay | Admitting: Gastroenterology

## 2020-06-21 VITALS — Ht 68.5 in | Wt 197.6 lb

## 2020-06-21 DIAGNOSIS — Z1211 Encounter for screening for malignant neoplasm of colon: Secondary | ICD-10-CM

## 2020-06-21 MED ORDER — CLENPIQ 10-3.5-12 MG-GM -GM/160ML PO SOLN: 1.0000 | Freq: Once | ORAL | 0 refills | Status: AC

## 2020-06-21 NOTE — Progress Notes (Signed)
Completed covid vaccines 04-06-20  Pt is aware that care partner will wait in the car during procedure; if they feel like they will be too hot or cold to wait in the car; they may wait in the 4 th floor lobby. Patient is aware to bring only one care partner. We want them to wear a mask (we do not have any that we can provide them), practice social distancing, and we will check their temperatures when they get here.  I did remind the patient that their care partner needs to stay in the parking lot the entire time and have a cell phone available, we will call them when the pt is ready for discharge. Patient will wear mask into building.  Pt denies trouble moving neck or fam hx/hx of malignant hyperthermia   No egg or soy allergy  No home oxygen use   No medications for weight loss taken  emmi information given  Pt denies constipation issues  Clenpiq coupon given

## 2020-07-16 ENCOUNTER — Ambulatory Visit (AMBULATORY_SURGERY_CENTER): Payer: Managed Care, Other (non HMO) | Admitting: Gastroenterology

## 2020-07-16 ENCOUNTER — Other Ambulatory Visit: Payer: Self-pay

## 2020-07-16 ENCOUNTER — Encounter: Payer: Self-pay | Admitting: Gastroenterology

## 2020-07-16 VITALS — BP 119/71 | HR 54 | Temp 97.5°F | Resp 17 | Ht 68.5 in | Wt 197.6 lb

## 2020-07-16 DIAGNOSIS — Z1211 Encounter for screening for malignant neoplasm of colon: Secondary | ICD-10-CM

## 2020-07-16 MED ORDER — SODIUM CHLORIDE 0.9 % IV SOLN: 500.0000 mL | Freq: Once | INTRAVENOUS | Status: DC

## 2020-07-16 NOTE — Op Note (Signed)
[facility] Patient Name: Danny Vincent Procedure Date: 07/16/2020 10:59 AM MRN: [id number] Endoscopist: Jackquline Denmark , MD Age: 59 Referring MD:  Date of Birth: 1960/12/18 Gender: Male Account #: [id number] Procedure:                Colonoscopy Indications:              Screening for colorectal malignant neoplasm Medicines:                Monitored Anesthesia Care Procedure:                Pre-Anesthesia Assessment:                           - Prior to the procedure, a History and Physical                            was performed, and patient medications and                            allergies were reviewed. The patient's tolerance of                            previous anesthesia was also reviewed. The risks                            and benefits of the procedure and the sedation                            options and risks were discussed with the patient.                            All questions were answered, and informed consent                            was obtained. Prior Anticoagulants: The patient has                            taken no previous anticoagulant or antiplatelet                            agents. ASA Grade Assessment: II - A patient with                            mild systemic disease. After reviewing the risks                            and benefits, the patient was deemed in                            satisfactory condition to undergo the procedure.                           After obtaining informed consent, the colonoscope  was passed under direct vision. Throughout the                            procedure, the patient's blood pressure, pulse, and                            oxygen saturations were monitored continuously. The                            Colonoscope was introduced through the anus and                            advanced to the 2 cm into the ileum. The                            colonoscopy was performed  without difficulty. The                            patient tolerated the procedure well. The quality                            of the bowel preparation was good. The terminal                            ileum, ileocecal valve, appendiceal orifice, and                            rectum were photographed. Scope In: 11:02:37 AM Scope Out: 11:10:24 AM Scope Withdrawal Time: 0 hours 3 minutes 57 seconds  Total Procedure Duration: 0 hours 7 minutes 47 seconds  Findings:                 A few small-mouthed diverticula were found in the                            sigmoid colon.                           Non-bleeding internal hemorrhoids were found during                            retroflexion. The hemorrhoids were small.                           The terminal ileum appeared normal.                           The exam was otherwise without abnormality on                            direct and retroflexion views. Complications:            No immediate complications. Estimated Blood Loss:     Estimated blood loss: none. Impression:               - Minimal sigmoid diverticulosis.                           -  Non-bleeding internal hemorrhoids.                           - The examined portion of the ileum was normal.                           - The examination was otherwise normal on direct                            and retroflexion views.                           - No specimens collected. Recommendation:           - Patient has a contact number available for                            emergencies. The signs and symptoms of potential                            delayed complications were discussed with the                            patient. Return to normal activities tomorrow.                            Written discharge instructions were provided to the                            patient.                           - High fiber diet.                           - Continue present medications.                            - Repeat colonoscopy in 10 years for screening                            purposes. Earlier, if with any new problems or                            change in family history.                           - Return to GI office PRN.                           - The findings and recommendations were discussed                            with the patient's family. Jackquline Denmark, MD 07/16/2020 11:15:34 AM This report has been signed electronically.

## 2020-07-16 NOTE — Progress Notes (Signed)
Pt's states no medical or surgical changes since previsit or office visit.  VS CW  

## 2020-07-16 NOTE — Patient Instructions (Signed)
Discharge instructions given. °Handouts on Diverticulosis and Hemorrhoids. °Resume previous medications. °YOU HAD AN ENDOSCOPIC PROCEDURE TODAY AT THE [facility]:   Refer to the procedure report that was given to you for any specific questions about what was found during the examination.  If the procedure report does not answer your questions, please call your gastroenterologist to clarify.  If you requested that your care partner not be given the details of your procedure findings, then the procedure report has been included in a sealed envelope for you to review at your convenience later. ° °YOU SHOULD EXPECT: Some feelings of bloating in the abdomen. Passage of more gas than usual.  Walking can help get rid of the air that was put into your GI tract during the procedure and reduce the bloating. If you had a lower endoscopy (such as a colonoscopy or flexible sigmoidoscopy) you may notice spotting of blood in your stool or on the toilet paper. If you underwent a bowel prep for your procedure, you may not have a normal bowel movement for a few days. ° °Please Note:  You might notice some irritation and congestion in your nose or some drainage.  This is from the oxygen used during your procedure.  There is no need for concern and it should clear up in a day or so. ° °SYMPTOMS TO REPORT IMMEDIATELY: ° °Following lower endoscopy (colonoscopy or flexible sigmoidoscopy): ° Excessive amounts of blood in the stool ° Significant tenderness or worsening of abdominal pains ° Swelling of the abdomen that is new, acute ° Fever of 100°F or higher ° ° °For urgent or emergent issues, a gastroenterologist can be reached at any hour by calling [phone]. °Do not use MyChart messaging for urgent concerns.  ° ° °DIET:  We do recommend a small meal at first, but then you may proceed to your regular diet.  Drink plenty of fluids but you should avoid alcoholic beverages for 24 hours. ° °ACTIVITY:  You should plan to  take it easy for the rest of today and you should NOT DRIVE or use heavy machinery until tomorrow (because of the sedation medicines used during the test).   ° °FOLLOW UP: °Our staff will call the number listed on your records 48-72 hours following your procedure to check on you and address any questions or concerns that you may have regarding the information given to you following your procedure. If we do not reach you, we will leave a message.  We will attempt to reach you two times.  During this call, we will ask if you have developed any symptoms of COVID 19. If you develop any symptoms (ie: fever, flu-like symptoms, shortness of breath, cough etc.) before then, please call [phone].  If you test positive for Covid 19 in the 2 weeks post procedure, please call and report this information to us.   ° °If any biopsies were taken you will be contacted by phone or by letter within the next 1-3 weeks.  Please call us at [phone] if you have not heard about the biopsies in 3 weeks.  ° ° °SIGNATURES/CONFIDENTIALITY: °You and/or your care partner have signed paperwork which will be entered into your electronic medical record.  These signatures attest to the fact that that the information above on your After Visit Summary has been reviewed and is understood.  Full responsibility of the confidentiality of this discharge information lies with you and/or your care-partner.  °

## 2020-07-16 NOTE — Progress Notes (Signed)
Report to PACU, RN, vss, BBS= Clear.  

## 2020-07-18 ENCOUNTER — Telehealth: Payer: Self-pay | Admitting: *Deleted

## 2020-07-18 NOTE — Telephone Encounter (Signed)
°  Follow up Call-  Call back number 07/16/2020  Post procedure Call Back phone  # [phone]  Permission to leave phone message Yes  Some recent data might be hidden     Patient questions:  Do you have a fever, pain , or abdominal swelling? No. Pain Score  0 *  Have you tolerated food without any problems? Yes.    Have you been able to return to your normal activities? Yes.    Do you have any questions about your discharge instructions: Diet   No. Medications  No. Follow up visit  No.  Do you have questions or concerns about your Care? No.  Actions: * If pain score is 4 or above: No action needed, pain <4.  1. Have you developed a fever since your procedure? no  2.   Have you had an respiratory symptoms (SOB or cough) since your procedure? no  3.   Have you tested positive for COVID 19 since your procedure no  4.   Have you had any family members/close contacts diagnosed with the COVID 19 since your procedure?  no   If yes to any of these questions please route to Joylene John, RN and Joella Prince, RN

## 2020-09-30 ENCOUNTER — Ambulatory Visit: Payer: Managed Care, Other (non HMO) | Admitting: Family Medicine

## 2020-09-30 ENCOUNTER — Encounter: Payer: Self-pay | Admitting: Family Medicine

## 2020-09-30 ENCOUNTER — Other Ambulatory Visit: Payer: Self-pay

## 2020-09-30 VITALS — BP 112/78 | HR 63 | Temp 98.8°F | Ht 68.5 in | Wt 201.0 lb

## 2020-09-30 DIAGNOSIS — E1165 Type 2 diabetes mellitus with hyperglycemia: Secondary | ICD-10-CM

## 2020-09-30 DIAGNOSIS — I10 Essential (primary) hypertension: Secondary | ICD-10-CM | POA: Diagnosis not present

## 2020-09-30 LAB — LIPID PANEL
Cholesterol: 102 mg/dL (ref 0–200)
HDL: 37.8 mg/dL — ABNORMAL LOW (ref >39.00)
LDL Cholesterol: 49 mg/dL (ref 0–99)
NonHDL: 64.24
Total CHOL/HDL Ratio: 3
Triglycerides: 78 mg/dL (ref 0.0–149.0)
VLDL: 15.6 mg/dL (ref 0.0–40.0)

## 2020-09-30 LAB — COMPREHENSIVE METABOLIC PANEL
ALT: 24 U/L (ref 0–53)
AST: 15 U/L (ref 0–37)
Albumin: 4.4 g/dL (ref 3.5–5.2)
Alkaline Phosphatase: 75 U/L (ref 39–117)
BUN: 11 mg/dL (ref 6–23)
CO2: 27 mEq/L (ref 19–32)
Calcium: 9.5 mg/dL (ref 8.4–10.5)
Chloride: 107 mEq/L (ref 96–112)
Creatinine, Ser: 1.05 mg/dL (ref 0.40–1.50)
GFR: 78.69 mL/min (ref >60.00)
Glucose, Bld: 145 mg/dL — ABNORMAL HIGH (ref 70–99)
Potassium: 3.8 mEq/L (ref 3.5–5.1)
Sodium: 141 mEq/L (ref 135–145)
Total Bilirubin: 0.8 mg/dL (ref 0.2–1.2)
Total Protein: 7.1 g/dL (ref 6.0–8.3)

## 2020-09-30 LAB — HEMOGLOBIN A1C: Hgb A1c MFr Bld: 6.9 % — ABNORMAL HIGH (ref 4.6–6.5)

## 2020-09-30 NOTE — Patient Instructions (Addendum)
Give us 2-3 business days to get the results of your labs back.   Keep the diet clean and stay active.  Because your blood pressure is well-controlled, you no longer have to check your blood pressure at home anymore unless you wish. Some people check it twice daily every day and some people stop altogether. Either or anything in between is fine. Strong work!  Let us know if you need anything. 

## 2020-09-30 NOTE — Progress Notes (Signed)
Subjective:   Chief Complaint  Patient presents with  . Follow-up    Danny Vincent is a 60 y.o. male here for follow-up of diabetes.   [name] does not routinely monitor sugars at home.  Patient does not require insulin.   Medications include: diet controlled Diet is fair overall.  Exercise: walking  Hypertension Patient presents for hypertension follow up. He does monitor home blood pressures. Blood pressures ranging on average from 130's/80's. He is compliant with medications- Exforge 5-320 mg/d. Diet/exercise as above.  Patient has these side effects of medication: none No Cp or SOB.  Past Medical History:  Diagnosis Date  . Hyperlipidemia   . Hypertension      Related testing: Retinal exam: Done Pneumovax: done  Objective:  BP 112/78 (BP Location: Left Arm, Patient Position: Sitting, Cuff Size: Normal)   Pulse 63   Temp 98.8 F (37.1 C) (Oral)   Ht 5' 8.5" (1.74 m)   Wt 201 lb (91.2 kg)   SpO2 97%   BMI 30.12 kg/m  General:  Well developed, well nourished, in no apparent distress Skin:  Warm, no pallor or diaphoresis Lungs:  CTAB, no access msc use Cardio:  RRR, no bruits, no LE edema Psych: Age appropriate judgment and insight  Assessment:   Type 2 diabetes mellitus with hyperglycemia, without long-term current use of insulin ([facility]) - Plan: Hemoglobin A1c, Lipid panel, Comprehensive metabolic panel  Essential hypertension   Plan:   1. Counseled on diet and exercise. Ck above. 2. Cont Exforge 5-320 mg/d. No need to ck BP at home.  F/u in 6 mo for CPE. The patient voiced understanding and agreement to the plan.  [place], DO 09/30/20 9:20 AM

## 2020-10-14 ENCOUNTER — Encounter: Payer: Self-pay | Admitting: Radiation Oncology

## 2020-10-14 DIAGNOSIS — R509 Fever, unspecified: Secondary | ICD-10-CM | POA: Insufficient documentation

## 2020-10-14 DIAGNOSIS — J029 Acute pharyngitis, unspecified: Secondary | ICD-10-CM | POA: Insufficient documentation

## 2020-10-14 NOTE — Progress Notes (Signed)
GU Location of Tumor / Histology: prostatic adenocarcinoma  If Prostate Cancer, Gleason Score is (3 + 4) and PSA is (9.97). Prostate volume: 21.87 grams   Danny Vincent presented with a PSA of 8.78 in July 2021. Father and all four paternal uncle with hx of prostate cancer.  Biopsies of prostate (if applicable) revealed:    Past/Anticipated interventions by urology, if any: prostate biopsy, referral to Dr. Tammi Klippel to discuss radiation options.  Past/Anticipated interventions by medical oncology, if any: no  Weight changes, if any: denies  Bowel/Bladder complaints, if any: IPSS 2. SHIM 21. Denies dysuria or hematuria. Denies urinary leakage or incontinence. Denies any bowel complaints.   Nausea/Vomiting, if any: denies  Pain issues, if any:  Reports intermittent shoulder pain.   SAFETY ISSUES:  Prior radiation? denies  Pacemaker/ICD? denies  Possible current pregnancy? no, male patient  Is the patient on methotrexate? denies  Current Complaints / other details:  60 year old male. Married. Resides in [place]. Works for [employer].

## 2020-10-15 ENCOUNTER — Ambulatory Visit
Admission: RE | Admit: 2020-10-15 | Discharge: 2020-10-15 | Disposition: A | Discharge status: Home / Self Care | Payer: Managed Care, Other (non HMO) | Source: Ambulatory Visit | Attending: Radiation Oncology | Admitting: Radiation Oncology

## 2020-10-15 ENCOUNTER — Other Ambulatory Visit: Payer: Self-pay

## 2020-10-15 ENCOUNTER — Encounter: Payer: Self-pay | Admitting: Medical Oncology

## 2020-10-15 ENCOUNTER — Encounter: Payer: Self-pay | Admitting: Radiation Oncology

## 2020-10-15 VITALS — BP 153/90 | HR 60 | Temp 96.8°F | Resp 18 | Ht 68.5 in | Wt 203.5 lb

## 2020-10-15 DIAGNOSIS — I10 Essential (primary) hypertension: Secondary | ICD-10-CM | POA: Diagnosis not present

## 2020-10-15 DIAGNOSIS — Z8042 Family history of malignant neoplasm of prostate: Secondary | ICD-10-CM | POA: Diagnosis not present

## 2020-10-15 DIAGNOSIS — Z803 Family history of malignant neoplasm of breast: Secondary | ICD-10-CM | POA: Diagnosis not present

## 2020-10-15 DIAGNOSIS — E785 Hyperlipidemia, unspecified: Secondary | ICD-10-CM | POA: Diagnosis not present

## 2020-10-15 DIAGNOSIS — C61 Malignant neoplasm of prostate: Secondary | ICD-10-CM

## 2020-10-15 DIAGNOSIS — Z79899 Other long term (current) drug therapy: Secondary | ICD-10-CM | POA: Diagnosis not present

## 2020-10-15 HISTORY — DX: Malignant neoplasm of prostate: C61

## 2020-10-15 NOTE — Progress Notes (Signed)
Radiation Oncology         [phone]) [phone] ________________________________  Initial outpatient Consultation  Name: Danny Vincent MRN: [id number]  Date: 10/15/2020  DOB: [birth year]  BM:[id number], Jilda Roche, DO  Pace, Weyman Croon, MD   REFERRING PHYSICIAN: Noel Christmas, MD  DIAGNOSIS: 60 y.o. gentleman with Stage T1c adenocarcinoma of the prostate with Gleason score of 3+4, and PSA of 9.97.    ICD-10-CM   1. Malignant neoplasm of prostate (HCC)  C61     HISTORY OF PRESENT ILLNESS: Danny Vincent is a 60 y.o. male with a diagnosis of prostate cancer. He was noted to have an elevated PSA of 8.78 by his primary care physician, Dr. Carmelia Roller.  Accordingly, he was referred for evaluation in urology by Dr. Arita Miss on 07/05/2020,  digital rectal examination was performed at that time revealing no nodules but slight asymmetry with right lobe larger.  A repeat PSA at that time showed a further increase to 9.97.  The patient proceeded to transrectal ultrasound with 12 biopsies of the prostate on 08/22/2020.  The prostate volume measured 21.87 cc.  Out of 12 core biopsies, 7 were positive, including all apex cores.  The maximum Gleason score was 3+4, and this was seen in the left mid lateral, left apex lateral, left apex, right mid, right apex, right mid lateral, and right apex lateral.  He has a very strong family history of prostate cancer in his father and 4 paternal uncles.  There is also a family history of breast cancer in his sister.  The patient reviewed the biopsy results with his urologist and he has kindly been referred today for discussion of potential radiation treatment options.   PREVIOUS RADIATION THERAPY: No  PAST MEDICAL HISTORY:  Past Medical History:  Diagnosis Date  . Hyperlipidemia   . Hypertension   . Prostate cancer (HCC)       PAST SURGICAL HISTORY: Past Surgical History:  Procedure Laterality Date  . NO PAST SURGERIES    . PROSTATE BIOPSY      FAMILY  HISTORY:  Family History  Problem Relation Age of Onset  . Diabetes Mother   . Hypertension Mother   . Diabetes Father   . Cancer Father 86       prostate cancer  . Prostate cancer Father   . Prostate cancer Paternal Uncle   . Prostate cancer Paternal Uncle   . Prostate cancer Paternal Uncle   . Prostate cancer Paternal Uncle   . Esophageal cancer Neg Hx   . Rectal cancer Neg Hx   . Stomach cancer Neg Hx   . Breast cancer Neg Hx   . Colon cancer Neg Hx     SOCIAL HISTORY:  Social History   Socioeconomic History  . Marital status: Married    Spouse name: Not on file  . Number of children: 2  . Years of education: Not on file  . Highest education level: Not on file  Occupational History  . Occupation: dock    Comment: full time  Tobacco Use  . Smoking status: Never Smoker  . Smokeless tobacco: Never Used  Vaping Use  . Vaping Use: Never used  Substance and Sexual Activity  . Alcohol use: Not Currently  . Drug use: Never  . Sexual activity: Yes  Other Topics Concern  . Not on file  Social History Narrative  . Not on file   Social Determinants of Health   Financial Resource Strain: Not on file  Food  Insecurity: Not on file  Transportation Needs: Not on file  Physical Activity: Not on file  Stress: Not on file  Social Connections: Not on file  Intimate Partner Violence: Not on file    ALLERGIES: Patient has no known allergies.  MEDICATIONS:  Current Outpatient Medications  Medication Sig Dispense Refill  . amLODipine-valsartan (EXFORGE) 5-320 MG tablet Take 1 tablet by mouth daily. 90 tablet 2  . Cyanocobalamin (VITAMIN B-12 PO) Take by mouth daily.    Marland Kitchen ECHINACEA PO Take by mouth daily.    Marland Kitchen GINSENG PO Take by mouth daily.    . Multiple Vitamins-Minerals (ZINC PO) Take by mouth daily.    . rosuvastatin (CRESTOR) 20 MG tablet Take 1 tablet (20 mg total) by mouth daily. 90 tablet 3  . Turmeric (QC TUMERIC COMPLEX PO) Take by mouth daily.    Marland Kitchen VITAMIN D  PO Take by mouth daily.     No current facility-administered medications for this encounter.    REVIEW OF SYSTEMS:  On review of systems, the patient reports that he is doing well overall. He denies any chest pain, shortness of breath, cough, fevers, chills, night sweats, unintended weight changes. He denies any bowel disturbances, and denies abdominal pain, nausea or vomiting. He denies any new musculoskeletal or joint aches or pains. His IPSS was 2, indicating mild urinary symptoms. His SHIM was 21, indicating he has some mild erectile dysfunction. A complete review of systems is obtained and is otherwise negative.    PHYSICAL EXAM:  Wt Readings from Last 3 Encounters:  10/15/20 203 lb 8 oz (92.3 kg)  09/30/20 201 lb (91.2 kg)  07/16/20 197 lb 9.6 oz (89.6 kg)   Temp Readings from Last 3 Encounters:  10/15/20 (!) 96.8 F (36 C) (Temporal)  09/30/20 98.8 F (37.1 C) (Oral)  07/16/20 (!) 97.5 F (36.4 C) (Temporal)   BP Readings from Last 3 Encounters:  10/15/20 (!) 153/90  09/30/20 112/78  07/16/20 119/71   Pulse Readings from Last 3 Encounters:  10/15/20 60  09/30/20 63  07/16/20 (!) 54   Pain Assessment Pain Score: 0-No pain/10  In general this is a well appearing African-American man in no acute distress. He's alert and oriented x4 and appropriate throughout the examination. Cardiopulmonary assessment is negative for acute distress, and he exhibits normal effort.     KPS = 100  100 - Normal; no complaints; no evidence of disease. 90   - Able to carry on normal activity; minor signs or symptoms of disease. 80   - Normal activity with effort; some signs or symptoms of disease. 61   - Cares for self; unable to carry on normal activity or to do active work. 60   - Requires occasional assistance, but is able to care for most of his personal needs. 50   - Requires considerable assistance and frequent medical care. 81   - Disabled; requires special care and assistance. 84    - Severely disabled; hospital admission is indicated although death not imminent. 1   - Very sick; hospital admission necessary; active supportive treatment necessary. 10   - Moribund; fatal processes progressing rapidly. 0     - Dead  Karnofsky DA, Abelmann [place], Craver LS and Burchenal [facility] [phone]) The use of the nitrogen mustards in the palliative treatment of carcinoma: with particular reference to bronchogenic carcinoma Cancer 1 [phone]  LABORATORY DATA:  Lab Results  Component Value Date   WBC 8.6 03/29/2020   HGB 13.5 03/29/2020  HCT 42.4 03/29/2020   MCV 83.8 03/29/2020   PLT 291.0 03/29/2020   Lab Results  Component Value Date   NA 141 09/30/2020   K 3.8 09/30/2020   CL 107 09/30/2020   CO2 27 09/30/2020   Lab Results  Component Value Date   ALT 24 09/30/2020   AST 15 09/30/2020   ALKPHOS 75 09/30/2020   BILITOT 0.8 09/30/2020     RADIOGRAPHY: No results found.    IMPRESSION/PLAN: 1. 60 y.o. gentleman with Stage T1c adenocarcinoma of the prostate with Gleason Score of 3+4, and PSA of 9.97. We discussed the patient's workup and outlined the nature of prostate cancer in this setting. The patient's T stage, Gleason's score, and PSA put him into the favorable intermediate risk group. Accordingly, he is eligible for a variety of potential treatment options including brachytherapy, 5.5 weeks of external radiation, or prostatectomy. We discussed the available radiation techniques, and focused on the details and logistics of delivery. We discussed and outlined the risks, benefits, short and long-term effects associated with radiotherapy and compared and contrasted these with prostatectomy. We discussed the role of SpaceOAR gel in reducing the rectal toxicity associated with radiotherapy.  He appears to have a good understanding of his disease and our treatment recommendations which are of curative intent.  He was encouraged to ask questions that were answered to his stated  satisfaction.  At the conclusion of our conversation, the patient is interested in moving forward with brachytherapy and use of SpaceOAR gel to reduce rectal toxicity from radiotherapy.  We will share our discussion with Dr. Claudia Desanctis and move forward with scheduling his CT [facility] planning appointment in the near future.  The patient met briefly with Romie Jumper in our office who will be working closely with him to coordinate OR scheduling and pre and post procedure appointments.  We will contact the pharmaceutical rep to ensure that [place] is available at the time of procedure.  We enjoyed meeting him today and look forward to continuing to participate in his care.   2. Strong family history of prostate cancer.  The patient was counseled regarding the possibility of a familial predisposition for cancers given his strong family history of prostate cancer in his father and 4 paternal uncles as well as a history of breast cancer in his sister.  After further discussion regarding the potential benefits to himself, his siblings, his children and future generations, he is interested in proceeding with genetic counseling/testing. A referral will be made to one of our genetic counselors here in the cancer center for further assessment.    Nicholos Johns, PA-C    Tyler Pita, MD  [facility] Direct Dial: [phone]  Fax: [phone] [email or web address]  Skype  [email or web address]   This document serves as a record of services personally performed by Tyler Pita, MD and Freeman Caldron, PA-C. It was created on their behalf by Wilburn Mylar, a trained medical scribe. The creation of this record is based on the scribe's personal observations and the provider's statements to them. This document has been checked and approved by the attending provider.

## 2020-10-15 NOTE — Progress Notes (Signed)
Introduced myself to patient as the prostate nurse navigator and discussed my role. He has a strong family history of prostate cancer including, four uncles and his father. He has two sons. We discussed genetic counseling and will be referred. He is most interested in brachytherapy after learning his treatment options. I gave him my business card and asked him to call me with questions or concerns. He voiced understanding.

## 2020-10-17 ENCOUNTER — Other Ambulatory Visit: Payer: Self-pay | Admitting: Urology

## 2020-10-17 DIAGNOSIS — C61 Malignant neoplasm of prostate: Secondary | ICD-10-CM

## 2020-10-18 ENCOUNTER — Telehealth: Payer: Self-pay | Admitting: *Deleted

## 2020-10-18 ENCOUNTER — Telehealth: Payer: Self-pay | Admitting: Genetic Counselor

## 2020-10-18 NOTE — Telephone Encounter (Signed)
Received a genetic counseling referral from rad onc for prostate cancer. Danny Vincent has been cld and scheduled to see [name] on 2/3 at 9am for an in-person visit. Pt aware to arrive 15 minutes early.

## 2020-10-18 NOTE — Telephone Encounter (Signed)
Called patient to inform of pre-seed appts. for 11-07-20, spoke with patient and he is aware of these appts.

## 2020-10-23 ENCOUNTER — Ambulatory Visit: Payer: Managed Care, Other (non HMO) | Admitting: Family Medicine

## 2020-10-24 ENCOUNTER — Other Ambulatory Visit: Payer: Self-pay | Admitting: Genetic Counselor

## 2020-10-24 ENCOUNTER — Inpatient Hospital Stay: Payer: Managed Care, Other (non HMO) | Attending: Radiation Oncology | Admitting: Genetic Counselor

## 2020-10-24 ENCOUNTER — Other Ambulatory Visit: Payer: Self-pay

## 2020-10-24 ENCOUNTER — Telehealth: Payer: Self-pay | Admitting: *Deleted

## 2020-10-24 ENCOUNTER — Inpatient Hospital Stay: Payer: Managed Care, Other (non HMO)

## 2020-10-24 DIAGNOSIS — Z801 Family history of malignant neoplasm of trachea, bronchus and lung: Secondary | ICD-10-CM

## 2020-10-24 DIAGNOSIS — C61 Malignant neoplasm of prostate: Secondary | ICD-10-CM

## 2020-10-24 DIAGNOSIS — Z8042 Family history of malignant neoplasm of prostate: Secondary | ICD-10-CM

## 2020-10-24 DIAGNOSIS — Z803 Family history of malignant neoplasm of breast: Secondary | ICD-10-CM

## 2020-10-24 LAB — GENETIC SCREENING ORDER

## 2020-10-24 NOTE — Telephone Encounter (Signed)
Called patient to inform of pre-seed appts. for 11-07-20 and his implant for 12-03-20 @ 1 pm, spoke with patient and he is aware of these appts.

## 2020-10-24 NOTE — Progress Notes (Signed)
REFERRING PROVIDER: Freeman Caldron, PA-C [place],  [place] [id number]   PRIMARY PROVIDER:  Shelda Pal, DO  PRIMARY REASON FOR VISIT:  Encounter Diagnoses  Name Primary?  . Malignant neoplasm of prostate ([place]) Yes  . Family history of prostate cancer   . Family history of breast cancer   . Family history of lung cancer      HISTORY OF PRESENT ILLNESS:   Danny Vincent, a 60 y.o. male, was seen for a [facility] cancer genetics consultation at the request of Ashlyn Bruning, PA-C due to a personal and family history of cancer.  Danny Vincent presents to clinic today to discuss the possibility of a hereditary predisposition to cancer, to discuss genetic testing, and to further clarify his future cancer risks, as well as potential cancer risks for family members.   At the age of 29, Danny Vincent was diagnosed with adenocarcinoma of the prostate with a maximum Gleason score of 3+4=7.  The treatment plan includes radiation seeds and brachytherapy.    CANCER HISTORY:  Oncology History  Malignant neoplasm of prostate ([place])  08/22/2020 Cancer Staging   Staging form: Prostate, AJCC 8th Edition - Clinical stage from 08/22/2020: Stage IIB (cT1c, cN0, cM0, PSA: 10, Grade Group: 2) - Signed by Freeman Caldron, PA-C on 10/15/2020   10/15/2020 Initial Diagnosis   Malignant neoplasm of prostate ([place])      RISK FACTORS:  Prostate cancer screening: see history noted above Colonoscopy: most recent in October 2021; plans to return for follow-up in 10 years Other cancer screening: none Other exposures/excessive radiation exposure: none reported  Past Medical History:  Diagnosis Date  . Hyperlipidemia   . Hypertension   . Prostate cancer [facility])     Past Surgical History:  Procedure Laterality Date  . NO PAST SURGERIES    . PROSTATE BIOPSY      Social History   Socioeconomic History  . Marital status: Married    Spouse name: Not on file  . Number of children: 2  .  Years of education: Not on file  . Highest education level: Not on file  Occupational History  . Occupation: dock    Comment: full time  Tobacco Use  . Smoking status: Never Smoker  . Smokeless tobacco: Never Used  Vaping Use  . Vaping Use: Never used  Substance and Sexual Activity  . Alcohol use: Not Currently  . Drug use: Never  . Sexual activity: Yes  Other Topics Concern  . Not on file  Social History Narrative  . Not on file   Social Determinants of Health   Financial Resource Strain: Not on file  Food Insecurity: Not on file  Transportation Needs: Not on file  Physical Activity: Not on file  Stress: Not on file  Social Connections: Not on file     FAMILY HISTORY:  We obtained a detailed, 4-generation family history.  Significant diagnoses are listed below: Family History  Problem Relation Age of Onset  . Prostate cancer Father        dx late 65s-early 60s  . Breast cancer Sister 31  . Prostate cancer Paternal Uncle        mets, dx >50, d. 71s  . Prostate cancer Paternal Uncle        dx >50, d. 54s  . Prostate cancer Paternal Uncle        dx >50, d. 11s  . Prostate cancer Paternal Uncle        dx >  50, d. 51s  . Lung cancer Maternal Uncle        dx >50, smoking hx  . Prostate cancer Cousin 76        Danny Vincent has two sons, ages 69 and 43.  He has three full brothers, one paternal half brother, and two full sisters.  One sister was diagnosed with breast cancer at age 65.  Danny Vincent mother is 105 and does not have cancer.  His maternal uncle was diagnosed with lung cancer after age 42.  His maternal cousin was diagnosed with prostate cancer at age 71.  No other maternal family history of cancer was reported.   Danny Vincent's father was diagnosed with prostate cancer in his late 9s or early 61s and passed away at age 94.  Danny Vincent had four uncles, all of whom had prostate cancer diagnosed after age 20.  No other paternal family history of cancer was  reported.   Danny Vincent is unaware of previous family history of genetic testing for hereditary cancer risks. Patient's maternal ancestors are of African American descent, and paternal ancestors are of African American descent. There is no reported Ashkenazi Jewish ancestry. There is no known consanguinity.   GENETIC COUNSELING ASSESSMENT: Danny Vincent is a 60 y.o. male with a personal and family history of cancer which is somewhat suggestive of a hereditary cancer syndrome and predisposition to cancer given the presence of related cancers in his paternal family. We, therefore, discussed and recommended the following at today's visit.   DISCUSSION: We discussed that 5 - 10% of cancer is hereditary.  Many cases of hereditary prostate cancer are associated with mutations in BRCA1/2.  There are other genes that can be associated with hereditary prostate cancer syndromes.  Type of cancer risk and level of risk are gene-specific.  We discussed that testing is beneficial for several reasons including knowing how to follow individuals after completing their treatment, identifying whether potential treatment options would be beneficial, and understanding if other family members could be at risk for cancer and allowing them to undergo genetic testing.   We reviewed the characteristics, features and inheritance patterns of hereditary cancer syndromes. We also discussed genetic testing, including the appropriate family members to test, the process of testing, insurance coverage and turn-around-time for results. We discussed the implications of a negative, positive, carrier and/or variant of uncertain significant result. We recommended Danny Vincent pursue genetic testing for a panel that includes genes associated with breast and prostate cancer.   Danny Vincent  was offered a common hereditary cancer panel (47 genes) and an expanded pan-cancer panel (84 genes). Danny Vincent was informed of the benefits and limitations of  each panel, including that expanded pan-cancer panels contain genes that do not have clear management guidelines at this point in time.  We also discussed that as the number of genes included on a panel increases, the chances of variants of uncertain significance increases.  After considering the benefits and limitations of each gene panel, Danny Vincent  elected to have a common hereditary cancers panel through [facility].  The Common Hereditary Cancers Panel offered by [facility] includes sequencing and/or deletion duplication testing of the following 47 genes: APC, ATM, AXIN2, BARD1, BMPR1A, BRCA1, BRCA2, BRIP1, CDH1, CDK4, CDKN2A (p14ARF), CDKN2A (p16INK4a), CHEK2, CTNNA1, DICER1, EPCAM (Deletion/duplication testing only), GREM1 (promoter region deletion/duplication testing only), KIT, MEN1, MLH1, MSH2, MSH3, MSH6, MUTYH, NBN, NF1, NHTL1, PALB2, PDGFRA, PMS2, POLD1, POLE, PTEN, RAD50, RAD51C, RAD51D, SDHB, SDHC, SDHD, SMAD4, SMARCA4. STK11,  TP53, TSC1, TSC2, and VHL.  The following genes were evaluated for sequence changes only: SDHA and HOXB13 c.251G>A variant only.  Based on Mr. Hefner's personal and family history of cancer, he meets medical criteria for genetic testing. Despite that he meets criteria, he may still have an out of pocket cost. We also discussed [facility]'s sponsored genetic testing program for those affected with prostate cancer. Mr. Tanzi was informed that, through this program, [facility] offers testing at no cost to the patient and may elect to share de-identified patient information to third parties and commercial organizations.  After considering the sponsored program and being informed of other cost options, Mr. Dillenbeck elected to proceed with the genetic test through the sponsored program called [facility] Detect Hereditary Prostate Cancer.   PLAN: After considering the risks, benefits, and limitations, Danny Vincent provided informed consent to pursue genetic testing and the blood sample was  sent to [facility] for analysis of the Common Hereditary Cancers Panel. Results should be available within approximately 2-3 weeks' time, at which point they will be disclosed by telephone to Danny Vincent, as will any additional recommendations warranted by these results. Mr. Mccravy will receive a summary of his genetic counseling visit and a copy of his results once available. This information will also be available in Epic.   Lastly, we encouraged Mr. Urizar to remain in contact with cancer genetics annually so that we can continuously update the family history and inform him of any changes in cancer genetics and testing that may be of benefit for this family.   Mr. Pawloski questions were answered to his satisfaction today. Our contact information was provided should additional questions or concerns arise. Thank you for the referral and allowing [place] to share in the care of your patient.   Sherice Ijames M. Joette Catching, [place], [facility] Genetic Counselor [email or web address] (P) [phone]  The patient was seen for a total of 35 minutes in face-to-face genetic counseling.  Drs. Magrinat, Lindi Adie and/or Burr Medico were available to discuss this case as needed.    _______________________________________________________________________ For Office Staff:  Number of people involved in session: 1 Was an Intern/ student involved with case: no

## 2020-10-25 ENCOUNTER — Encounter: Payer: Self-pay | Admitting: Genetic Counselor

## 2020-10-29 ENCOUNTER — Other Ambulatory Visit: Payer: Self-pay | Admitting: Urology

## 2020-11-06 ENCOUNTER — Telehealth: Payer: Self-pay | Admitting: *Deleted

## 2020-11-06 NOTE — Telephone Encounter (Signed)
CALLED PATIENT TO REMIND OF PRE-SEED APPTS. FOR 11-07-20, LVM FOR A RETURN CALL

## 2020-11-07 ENCOUNTER — Encounter (HOSPITAL_COMMUNITY)
Admission: RE | Admit: 2020-11-07 | Discharge: 2020-11-07 | Disposition: A | Discharge status: Home / Self Care | Payer: Managed Care, Other (non HMO) | Source: Ambulatory Visit | Attending: Urology | Admitting: Urology

## 2020-11-07 ENCOUNTER — Other Ambulatory Visit: Payer: Self-pay

## 2020-11-07 ENCOUNTER — Ambulatory Visit
Admission: RE | Admit: 2020-11-07 | Discharge: 2020-11-07 | Disposition: A | Discharge status: Home / Self Care | Payer: Managed Care, Other (non HMO) | Source: Ambulatory Visit | Attending: Radiation Oncology | Admitting: Radiation Oncology

## 2020-11-07 ENCOUNTER — Ambulatory Visit
Admission: RE | Admit: 2020-11-07 | Discharge: 2020-11-07 | Disposition: A | Discharge status: Home / Self Care | Payer: Managed Care, Other (non HMO) | Source: Ambulatory Visit | Attending: Urology | Admitting: Urology

## 2020-11-07 ENCOUNTER — Ambulatory Visit (HOSPITAL_COMMUNITY)
Admission: RE | Admit: 2020-11-07 | Discharge: 2020-11-07 | Disposition: A | Discharge status: Home / Self Care | Payer: Managed Care, Other (non HMO) | Source: Ambulatory Visit | Attending: Urology | Admitting: Urology

## 2020-11-07 DIAGNOSIS — C61 Malignant neoplasm of prostate: Secondary | ICD-10-CM

## 2020-11-07 DIAGNOSIS — Z01818 Encounter for other preprocedural examination: Secondary | ICD-10-CM

## 2020-11-07 NOTE — Progress Notes (Signed)
  Radiation Oncology         [phone] ________________________________  Name: BAKER MORONTA MRN: [id number]  Date: 11/07/2020  DOB: 1961/08/06  SIMULATION AND TREATMENT PLANNING NOTE PUBIC ARCH STUDY  WU:[id number], Crosby Oyster, DO  Robley Fries, MD  DIAGNOSIS: 60 y.o. gentleman with Stage T1c adenocarcinoma of the prostate with Gleason score of 3+4, and PSA of 9.97.  Oncology History  Malignant neoplasm of prostate ([place])  08/22/2020 Cancer Staging   Staging form: Prostate, AJCC 8th Edition - Clinical stage from 08/22/2020: Stage IIB (cT1c, cN0, cM0, PSA: 10, Grade Group: 2) - Signed by Freeman Caldron, PA-C on 10/15/2020   10/15/2020 Initial Diagnosis   Malignant neoplasm of prostate ([place])       ICD-10-CM   1. Malignant neoplasm of prostate ([place])  C61     COMPLEX SIMULATION:  The patient presented today for evaluation for possible prostate seed implant. He was brought to the radiation planning suite and placed supine on the CT couch. A 3-dimensional image study set was obtained in upload to the planning computer. There, on each axial slice, I contoured the prostate gland. Then, using three-dimensional radiation planning tools I reconstructed the prostate in view of the structures from the transperineal needle pathway to assess for possible pubic arch interference. In doing so, I did not appreciate any pubic arch interference. Also, the patient's prostate volume was estimated based on the drawn structure. The volume was 23 cc.  Given the pubic arch appearance and prostate volume, patient remains a good candidate to proceed with prostate seed implant. Today, he freely provided informed written consent to proceed.    PLAN: The patient will undergo prostate seed implant.   ________________________________  Sheral Apley. Tammi Klippel, M.D.

## 2020-11-11 ENCOUNTER — Encounter: Payer: Self-pay | Admitting: Genetic Counselor

## 2020-11-11 ENCOUNTER — Ambulatory Visit: Payer: Self-pay | Admitting: Genetic Counselor

## 2020-11-11 ENCOUNTER — Telehealth: Payer: Self-pay | Admitting: Genetic Counselor

## 2020-11-11 DIAGNOSIS — Z803 Family history of malignant neoplasm of breast: Secondary | ICD-10-CM

## 2020-11-11 DIAGNOSIS — C61 Malignant neoplasm of prostate: Secondary | ICD-10-CM

## 2020-11-11 DIAGNOSIS — Z801 Family history of malignant neoplasm of trachea, bronchus and lung: Secondary | ICD-10-CM

## 2020-11-11 DIAGNOSIS — Z1379 Encounter for other screening for genetic and chromosomal anomalies: Secondary | ICD-10-CM | POA: Insufficient documentation

## 2020-11-11 DIAGNOSIS — Z8042 Family history of malignant neoplasm of prostate: Secondary | ICD-10-CM

## 2020-11-11 NOTE — Progress Notes (Signed)
HPI:  Mr. Hands was previously seen in the [facility] clinic due to a personal and family history of cancer and concerns regarding a hereditary predisposition to cancer. Please refer to our prior cancer genetics clinic note for more information regarding our discussion, assessment and recommendations, at the time. Mr. Christen recent genetic test results were disclosed to him, as were recommendations warranted by these results. These results and recommendations are discussed in more detail below.  CANCER HISTORY:  At the age of 63, Mr. 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  The treatment plan includes radiation seeds and brachytherapy.  Oncology History  Malignant neoplasm of prostate ([place])  08/22/2020 Cancer Staging   Staging form: Prostate, AJCC 8th Edition - Clinical stage from 08/22/2020: Stage IIB (cT1c, cN0, cM0, PSA: 10, Grade Group: 2) - Signed by Freeman Caldron, PA-C on 10/15/2020   10/15/2020 Initial Diagnosis   Malignant neoplasm of prostate ([place])     FAMILY HISTORY:  We obtained a detailed, 4-generation family history.  Significant diagnoses are listed below: Family History  Problem Relation Age of Onset  . Prostate cancer Father        dx late 33s-early 60s  . Breast cancer Sister 68  . Prostate cancer Paternal Uncle        mets, dx >50, d. 15s  . Prostate cancer Paternal Uncle        dx >50, d. 4s  . Prostate cancer Paternal Uncle        dx >50, d. 65s  . Prostate cancer Paternal Uncle        dx >50, d. 25s  . Lung cancer Maternal Uncle        dx >50, smoking hx  . Prostate cancer Cousin 79        Mr. Kooi has two sons, ages 2 and 21.  He has three full brothers, one paternal half brother, and two full sisters.  One sister was diagnosed with breast cancer at age 67.  She had negative genetic testing previously (no report available for review). Mr. Monestime mother is 64 and does  not have cancer.  His maternal uncle was diagnosed with lung cancer after age 50.  His maternal cousin was diagnosed with prostate cancer at age 28.  No other maternal family history of cancer was reported.   Mr. Olafson's father was diagnosed with prostate cancer in his late 31s or early 27s and passed away at age 48.  Mr. Boehning had four uncles, all of whom had prostate cancer diagnosed after age 65.  No other paternal family history of cancer was reported.   Mr. Pitter is unaware of previous family history of genetic testing for hereditary cancer risks. Patient's maternal ancestors are of African American descent, and paternal ancestors are of African American descent. There is no reported Ashkenazi Jewish ancestry. There is no known consanguinity.  GENETIC TEST RESULTS: Genetic testing reported out on November 07, 2020.  The [facility] Common Hereditary Cancers Panel cancer panel found no pathogenic mutations. The Common Hereditary Cancers Panel offered by [facility] includes sequencing and/or deletion duplication testing of the following 47 genes: APC, ATM, AXIN2, BARD1, BMPR1A, BRCA1, BRCA2, BRIP1, CDH1, CDK4, CDKN2A (p14ARF), CDKN2A (p16INK4a), CHEK2, CTNNA1, DICER1, EPCAM (Deletion/duplication testing only), GREM1 (promoter region deletion/duplication testing only), GREM1, HOXB13, KIT, MEN1, MLH1, MSH2, MSH3, MSH6, MUTYH, NBN, NF1, NHTL1, PALB2, PDGFRA, PMS2, POLD1, POLE, PTEN, RAD50, RAD51C, RAD51D, SDHA, SDHB, SDHC, SDHD, SMAD4,  SMARCA4. STK11, TP53, TSC1, TSC2, and VHL.  The following genes were evaluated for sequence changes only: SDHA and HOXB13 c.251G>A variant only only: SDHA and HOXB13 c.251G>A variant only.  The test report has been scanned into EPIC and is located under the Molecular Pathology section of the Results Review tab.  A portion of the result report is included below for reference.     We discussed with Mr. Jabs that because current genetic testing is not perfect, it is  possible there may be a gene mutation in one of these genes that current testing cannot detect, but that chance is small.  We also discussed, that there could be another gene that has not yet been discovered, or that we have not yet tested, that is responsible for the cancer diagnoses in the family. It is also possible there is a hereditary cause for the cancer in the family that Mr. Santellan did not inherit and therefore was not identified in his testing.  Therefore, it is important to remain in touch with cancer genetics in the future so that we can continue to offer Mr. Pickney the most up to date genetic testing.     ADDITIONAL GENETIC TESTING: We discussed with Mr. Hain that there are other genes that are associated with increased cancer risk that can be analyzed. Should Mr. Lenoir wish to pursue additional genetic testing, we are happy to discuss and coordinate this testing, at any time.     CANCER SCREENING RECOMMENDATIONS: Mr. Schul's test result is considered negative (normal).  This means that we have not identified a hereditary cause for his personal history of prostate at this time. Most cancers happen by chance and this negative test suggests that his cancer may fall into this category.    This does not definitively rule out a hereditary predisposition to cancer. It is still possible that there could be genetic mutations that are undetectable by current technology. There could be genetic mutations in genes that have not been tested or identified to increase cancer risk.  Therefore, it is recommended he continue to follow the cancer management and screening guidelines provided by his oncology and primary healthcare provider.   An individual's cancer risk and medical management are not determined by genetic test results alone. Overall cancer risk assessment incorporates additional factors, including personal medical history, family history, and any available genetic information that may  result in a personalized plan for cancer prevention and surveillance RECOMMENDATIONS FOR FAMILY MEMBERS:  Individuals in this family might be at some increased risk of developing cancer, over the general population risk, simply due to the family history of cancer.  We recommended women in this family have a yearly mammogram beginning at age 35, or 71 years younger than the earliest onset of cancer, an annual clinical breast exam, and perform monthly breast self-exams. Women in this family should also have a gynecological exam as recommended by their primary provider. All family members should be referred for colonoscopy starting at age 64.  Males in the family should speak with their primary provider about prostate cancer screening.   It is also possible there is a hereditary cause for the cancer in Mr. Oyama family that he did not inherit and therefore was not identified in him.  Based on Mr. Shawn family history, we recommended first degree relatives of his paternal family members with prostate cancer have genetic counseling and testing. Mr. Portela will let us know if we can be of any assistance in coordinating genetic  counseling and/or testing for these family member.   FOLLOW-UP: Lastly, we discussed with Mr. Hackbart that cancer genetics is a rapidly advancing field and it is possible that new genetic tests will be appropriate for him and/or his family members in the future. We encouraged him to remain in contact with cancer genetics on an annual basis so we can update his personal and family histories and let him know of advances in cancer genetics that may benefit this family.   Our contact number was provided. Mr. Hagmann questions were answered to his satisfaction, and he knows he is welcome to call us at anytime with additional questions or concerns.    Siham Bucaro M. Joette Catching, [place], [facility] Genetic Counselor [email or web address] (P) [phone]

## 2020-11-11 NOTE — Telephone Encounter (Signed)
Revealed negative genetic testing.  Discussed that we do not know why he has prostate cancer or why there is cancer in the family. It could be familial, due to a different gene that we are not testing, or maybe our current technology may not be able to pick something up.  It will be important for him to keep in contact with genetics to keep up with whether additional testing may be needed.    

## 2020-11-27 ENCOUNTER — Encounter (HOSPITAL_BASED_OUTPATIENT_CLINIC_OR_DEPARTMENT_OTHER): Payer: Self-pay | Admitting: Urology

## 2020-11-27 ENCOUNTER — Other Ambulatory Visit: Payer: Self-pay

## 2020-11-27 NOTE — Progress Notes (Signed)
Spoke w/ via phone for pre-op interview---pt Lab needs dos---- none              Lab results------ekg 11-07-2020 (see  03-29-2020 ekg )epic, chest xray 2-17-200 [facility] lab appt 11-29-2020 [facility] cmp pt ptt 800  COVID test ------11-29-2020 930 Arrive at -------1100 am 12-03-2020  NPO after MN NO Solid Food.  Clear liquids from MN until---1000 am then npo Med rec completed Medications to take morning of surgery -----none Diabetic medication -----n/a Patient instructed to bring photo id and insurance card day of surgery Patient aware to have Driver (ride ) / caregiver  Wife irene will drop pt offdos    for 24 hours after surgery  Patient Special Instructions -----fleets enema am of surgery Pre-Op special Istructions -----none Patient verbalized understanding of instructions that were given at this phone interview. Patient denies shortness of breath, chest pain, fever, cough at this phone interview.

## 2020-11-28 ENCOUNTER — Telehealth: Payer: Self-pay | Admitting: *Deleted

## 2020-11-28 ENCOUNTER — Other Ambulatory Visit (HOSPITAL_COMMUNITY): Payer: Managed Care, Other (non HMO)

## 2020-11-28 NOTE — Telephone Encounter (Signed)
CALLED PATIENT TO REMIND OF LAB AND COVID TESTING FOR 11-29-20, LVM  FOR A RETURN  CALL

## 2020-11-29 ENCOUNTER — Encounter (HOSPITAL_COMMUNITY)
Admission: RE | Admit: 2020-11-29 | Discharge: 2020-11-29 | Disposition: A | Discharge status: Home / Self Care | Payer: Managed Care, Other (non HMO) | Source: Ambulatory Visit | Attending: Urology | Admitting: Urology

## 2020-11-29 ENCOUNTER — Other Ambulatory Visit (HOSPITAL_COMMUNITY)
Admission: RE | Admit: 2020-11-29 | Discharge: 2020-11-29 | Disposition: A | Discharge status: Home / Self Care | Payer: Managed Care, Other (non HMO) | Source: Ambulatory Visit | Attending: Urology | Admitting: Urology

## 2020-11-29 ENCOUNTER — Other Ambulatory Visit: Payer: Self-pay

## 2020-11-29 DIAGNOSIS — Z20822 Contact with and (suspected) exposure to covid-19: Secondary | ICD-10-CM | POA: Insufficient documentation

## 2020-11-29 DIAGNOSIS — Z01812 Encounter for preprocedural laboratory examination: Secondary | ICD-10-CM | POA: Insufficient documentation

## 2020-11-29 LAB — COMPREHENSIVE METABOLIC PANEL
ALT: 36 U/L (ref 0–44)
AST: 29 U/L (ref 15–41)
Albumin: 4.4 g/dL (ref 3.5–5.0)
Alkaline Phosphatase: 77 U/L (ref 38–126)
Anion gap: 10 (ref 5–15)
BUN: 16 mg/dL (ref 6–20)
CO2: 23 mmol/L (ref 22–32)
Calcium: 9.4 mg/dL (ref 8.9–10.3)
Chloride: 108 mmol/L (ref 98–111)
Creatinine, Ser: 0.99 mg/dL (ref 0.61–1.24)
GFR, Estimated: >60 mL/min (ref >60)
Glucose, Bld: 154 mg/dL — ABNORMAL HIGH (ref 70–99)
Potassium: 3.8 mmol/L (ref 3.5–5.1)
Sodium: 141 mmol/L (ref 135–145)
Total Bilirubin: 0.7 mg/dL (ref 0.3–1.2)
Total Protein: 7.9 g/dL (ref 6.5–8.1)

## 2020-11-29 LAB — CBC
HCT: 41.4 % (ref 39.0–52.0)
Hemoglobin: 13.3 g/dL (ref 13.0–17.0)
MCH: 26.8 pg (ref 26.0–34.0)
MCHC: 32.1 g/dL (ref 30.0–36.0)
MCV: 83.5 fL (ref 80.0–100.0)
Platelets: 280 10*3/uL (ref 150–400)
RBC: 4.96 MIL/uL (ref 4.22–5.81)
RDW: 15.6 % — ABNORMAL HIGH (ref 11.5–15.5)
WBC: 9.5 10*3/uL (ref 4.0–10.5)
nRBC: 0 % (ref 0.0–0.2)

## 2020-11-29 LAB — APTT: aPTT: 31 seconds (ref 24–36)

## 2020-11-29 LAB — SARS CORONAVIRUS 2 (TAT 6-24 HRS): SARS Coronavirus 2: NEGATIVE

## 2020-11-29 LAB — PROTIME-INR
INR: 1.1 (ref 0.8–1.2)
Prothrombin Time: 13.4 seconds (ref 11.4–15.2)

## 2020-11-29 NOTE — H&P (Signed)
CC/HPI: cc: prostate cancer   07/05/20: 60 year old man referred for an elevated PSA of 8.78 in July 2021. Patient's father had prostate cancer diagnosed in his 60s. Patient is unsure what treatment his father received but did say that his prostate cancer spread throughout his body. He is no longer living. Patient denies significant lower urinary tract symptoms. He does have mild erectile dysfunction but has never tried anything for it. He is able to get an erection but cannot maintain it. He is interested in trying Viagra. He does not take any cardiac meds.   08/22/20: Here for TRUS prostate biopsy   09/05/20: 60 year old man with a elevated PSA of 9.97 and family history of prostate cancer underwent TRUS prostate biopsy on 08/22/2020. Pathology shows Gleason 3+4=7 in 7/12 cores. Favorable intermediate risk prostate cancer. Grade group 2   11/19/2020: Here today for pre-op appointment prior to undergoing radioactive seed implant/SpaceOAR insertion with Dr Claudia Desanctis on 03/15. Patient denies any changes to past medical history. No changes to prescription medications taken on a daily basis. He has had no interval surgical or procedural intervention. He denies interval fevers or chills, nausea/vomiting, or infection treatment. He continues to void at his baseline with stable, grossly non bothersome symptomatology. Nocturia x1. Previous symptom score sheet assessment was 4. Urinalysis is clear today. He denies any interval burning or painful urination, visible blood in the urine. He is having normal bowel movements without constipation or diarrhea.     ALLERGIES: No Known Drug Allergies    MEDICATIONS: Clenpiq 10 mg-3.5 gram-12 gram/160 ml solution, oral  Rosuvastatin Calcium 20 mg tablet     GU PSH: Prostate Needle Biopsy - 08/22/2020     NON-GU PSH: Surgical Pathology, Gross And Microscopic Examination For Prostate Needle - 08/22/2020     GU PMH: ED due to arterial insufficiency - 09/05/2020, Patient  is interested in trying sildenafil p.r.n. sexual activity. I gave him a prescription for 50 mg of sildenafil to try 1 hour prior to sexual activity. I counseled him about possible side effects., - 07/05/2020 Prostate Cancer - 09/05/2020 Elevated PSA - 08/22/2020, DRE today without any concerning findings. Will repeat PSA and if still elevated proceed with prostate biopsy. We did discuss possible reasons for elevated PSA including prostate cancer, BPH, infection, inflammation, recent catheter and trauma. We talked about the risks and benefits of a prostate biopsy including but not limited to bleeding and post biopsy infection. I will call patient with results of the PSA and if still elevated schedule the biopsy., - 07/05/2020 Family Hx of Prostate Cancer - 07/05/2020    NON-GU PMH: None   FAMILY HISTORY: None   SOCIAL HISTORY: Marital Status: Married Preferred Language: English; Ethnicity: Not Hispanic Or Latino; Race: Black or African American Current Smoking Status: Patient has never smoked.   Tobacco Use Assessment Completed: Used Tobacco in last 30 days? Has never drank.  Drinks 1 caffeinated drink per day.    REVIEW OF SYSTEMS:    GU Review Male:   Patient reports get up at night to urinate. Patient denies frequent urination, hard to postpone urination, burning/ pain with urination, leakage of urine, stream starts and stops, trouble starting your stream, have to strain to urinate , erection problems, and penile pain.  Gastrointestinal (Upper):   Patient denies nausea, vomiting, and indigestion/ heartburn.  Gastrointestinal (Lower):   Patient denies diarrhea and constipation.  Constitutional:   Patient denies fever, night sweats, weight loss, and fatigue.  Skin:   Patient denies skin  rash/ lesion and itching.  Eyes:   Patient denies blurred vision and double vision.  Ears/ Nose/ Throat:   Patient denies sore throat and sinus problems.  Hematologic/Lymphatic:   Patient denies swollen  glands and easy bruising.  Cardiovascular:   Patient denies leg swelling and chest pains.  Respiratory:   Patient denies cough and shortness of breath.  Endocrine:   Patient denies excessive thirst.  Musculoskeletal:   Patient denies back pain and joint pain.  Neurological:   Patient denies headaches and dizziness.  Psychologic:   Patient denies depression and anxiety.   VITAL SIGNS:      11/19/2020 09:55 AM  Weight 201 lb / 91.17 kg  Height 68 in / 172.72 cm  BP 154/88 mmHg  Pulse 55 /min  Temperature 98.2 F / 36.7 C  BMI 30.6 kg/m   MULTI-SYSTEM PHYSICAL EXAMINATION:    Constitutional: Well-nourished. No physical deformities. Normally developed. Good grooming.  Neck: Neck symmetrical, not swollen. Normal tracheal position.  Respiratory: Normal breath sounds. No labored breathing, no use of accessory muscles.   Cardiovascular: Regular rate and rhythm. No murmur, no gallop. Normal temperature, normal extremity pulses, no swelling, no varicosities.   Skin: No paleness, no jaundice, no cyanosis. No lesion, no ulcer, no rash.  Neurologic / Psychiatric: Oriented to time, oriented to place, oriented to person. No depression, no anxiety, no agitation.  Gastrointestinal: No mass, no tenderness, no rigidity, non obese abdomen.  Musculoskeletal: Normal gait and station of head and neck.     Complexity of Data:  Source Of History:  Patient, Medical Record Summary  Lab Test Review:   PSA  Records Review:   AUA Symptom Score, Pathology Reports, Previous Doctor Records, Previous Hospital Records, Previous Patient Records  Urine Test Review:   Urinalysis   07/05/20  PSA  Total PSA 9.97 ng/mL    11/19/20  Urinalysis  Urine Appearance Clear   Urine Color Yellow   Urine Glucose Neg mg/dL  Urine Bilirubin Neg mg/dL  Urine Ketones Neg mg/dL  Urine Specific Gravity 1.025   Urine Blood Neg ery/uL  Urine pH 5.5   Urine Protein Neg mg/dL  Urine Urobilinogen 0.2 mg/dL  Urine Nitrites Neg    Urine Leukocyte Esterase Neg leu/uL   PROCEDURES:          Urinalysis Dipstick Dipstick Cont'd  Color: Yellow Bilirubin: Neg mg/dL  Appearance: Clear Ketones: Neg mg/dL  Specific Gravity: 1.025 Blood: Neg ery/uL  pH: 5.5 Protein: Neg mg/dL  Glucose: Neg mg/dL Urobilinogen: 0.2 mg/dL    Nitrites: Neg    Leukocyte Esterase: Neg leu/uL    ASSESSMENT:      ICD-10 Details  1 GU:   Prostate Cancer - C61 Chronic, Threat to Bodily Function  2   Nocturia - R35.1 Chronic, Stable - Nocturia x1   PLAN:           Orders Labs Urine Culture          Schedule Return Visit/Planned Activity: 1 Month - Office Visit, PVR          Document Letter(s):  Created for Patient: Clinical Summary         Notes:   All questions answered to the best my ability about upcoming procedure and expected postoperative course. Urine culture sent today to serve as baseline testing in anticipation of his upcoming procedure. Moving forward patient will keep scheduled follow-up date for radioactive seed implant on 03/15 with his urologist.

## 2020-12-02 ENCOUNTER — Telehealth: Payer: Self-pay | Admitting: *Deleted

## 2020-12-02 NOTE — Telephone Encounter (Signed)
CALLED PATIENT TO REMIND OF PROCEDURE FOR 12-03-20, SPOKE WITH PATIENT AND HE IS AWARE OF THIS PROCEDURE

## 2020-12-03 ENCOUNTER — Ambulatory Visit (HOSPITAL_BASED_OUTPATIENT_CLINIC_OR_DEPARTMENT_OTHER): Payer: Managed Care, Other (non HMO) | Admitting: Anesthesiology

## 2020-12-03 ENCOUNTER — Encounter (HOSPITAL_BASED_OUTPATIENT_CLINIC_OR_DEPARTMENT_OTHER)
Admission: RE | Disposition: A | Discharge status: Home / Self Care | Payer: Self-pay | Source: Home / Self Care | Attending: Urology

## 2020-12-03 ENCOUNTER — Ambulatory Visit (HOSPITAL_BASED_OUTPATIENT_CLINIC_OR_DEPARTMENT_OTHER)
Admission: RE | Admit: 2020-12-03 | Discharge: 2020-12-03 | Disposition: A | Discharge status: Home / Self Care | Payer: Managed Care, Other (non HMO) | Attending: Urology | Admitting: Urology

## 2020-12-03 ENCOUNTER — Encounter (HOSPITAL_BASED_OUTPATIENT_CLINIC_OR_DEPARTMENT_OTHER): Payer: Self-pay | Admitting: Urology

## 2020-12-03 ENCOUNTER — Ambulatory Visit (HOSPITAL_COMMUNITY): Payer: Managed Care, Other (non HMO)

## 2020-12-03 ENCOUNTER — Other Ambulatory Visit: Payer: Self-pay

## 2020-12-03 DIAGNOSIS — C61 Malignant neoplasm of prostate: Secondary | ICD-10-CM | POA: Diagnosis present

## 2020-12-03 HISTORY — DX: Presence of spectacles and contact lenses: Z97.3

## 2020-12-03 HISTORY — PX: RADIOACTIVE SEED IMPLANT: SHX5150

## 2020-12-03 HISTORY — PX: SPACE OAR INSTILLATION: SHX6769

## 2020-12-03 SURGERY — INSERTION, RADIATION SOURCE, PROSTATE
Anesthesia: General | Site: Rectum

## 2020-12-03 MED ORDER — LIDOCAINE 2% (20 MG/ML) 5 ML SYRINGE
INTRAMUSCULAR | Status: AC
  Filled 2020-12-03: qty 5

## 2020-12-03 MED ORDER — ONDANSETRON HCL 4 MG/2ML IJ SOLN
INTRAMUSCULAR | Status: AC
  Filled 2020-12-03: qty 2

## 2020-12-03 MED ORDER — SODIUM CHLORIDE 0.9 % IR SOLN
Status: DC | PRN
  Administered 2020-12-03: 1000 mL via INTRAVESICAL

## 2020-12-03 MED ORDER — ONDANSETRON HCL 4 MG/2ML IJ SOLN
INTRAMUSCULAR | Status: DC | PRN
  Administered 2020-12-03: 4 mg via INTRAVENOUS

## 2020-12-03 MED ORDER — DEXAMETHASONE SODIUM PHOSPHATE 10 MG/ML IJ SOLN
INTRAMUSCULAR | Status: AC
  Filled 2020-12-03: qty 1

## 2020-12-03 MED ORDER — EPHEDRINE SULFATE-NACL 50-0.9 MG/10ML-% IV SOSY
PREFILLED_SYRINGE | INTRAVENOUS | Status: DC | PRN
  Administered 2020-12-03 (×2): 10 mg via INTRAVENOUS

## 2020-12-03 MED ORDER — GLYCOPYRROLATE PF 0.2 MG/ML IJ SOSY
PREFILLED_SYRINGE | INTRAMUSCULAR | Status: DC | PRN
  Administered 2020-12-03: .2 mg via INTRAVENOUS

## 2020-12-03 MED ORDER — PROPOFOL 10 MG/ML IV BOLUS
INTRAVENOUS | Status: DC | PRN
  Administered 2020-12-03: 200 mg via INTRAVENOUS

## 2020-12-03 MED ORDER — HYDROCODONE-ACETAMINOPHEN 5-325 MG PO TABS: 1.0000 | ORAL_TABLET | ORAL | 0 refills | Status: DC | PRN

## 2020-12-03 MED ORDER — DEXAMETHASONE SODIUM PHOSPHATE 10 MG/ML IJ SOLN
INTRAMUSCULAR | Status: DC | PRN
  Administered 2020-12-03: 10 mg via INTRAVENOUS

## 2020-12-03 MED ORDER — OXYCODONE HCL 5 MG PO TABS: 5.0000 mg | ORAL_TABLET | Freq: Once | ORAL | Status: DC

## 2020-12-03 MED ORDER — ACETAMINOPHEN 160 MG/5ML PO SOLN: 325.0000 mg | Freq: Once | ORAL | Status: DC | PRN

## 2020-12-03 MED ORDER — LIDOCAINE 2% (20 MG/ML) 5 ML SYRINGE
INTRAMUSCULAR | Status: DC | PRN
  Administered 2020-12-03: 40 mg via INTRAVENOUS

## 2020-12-03 MED ORDER — IOHEXOL 300 MG/ML  SOLN
INTRAMUSCULAR | Status: DC | PRN
  Administered 2020-12-03: 7 mL

## 2020-12-03 MED ORDER — SODIUM CHLORIDE (PF) 0.9 % IJ SOLN
INTRAMUSCULAR | Status: DC | PRN
  Administered 2020-12-03: 10 mL

## 2020-12-03 MED ORDER — AMISULPRIDE (ANTIEMETIC) 5 MG/2ML IV SOLN: 10.0000 mg | Freq: Once | INTRAVENOUS | Status: DC | PRN

## 2020-12-03 MED ORDER — PROPOFOL 10 MG/ML IV BOLUS
INTRAVENOUS | Status: AC
  Filled 2020-12-03: qty 20

## 2020-12-03 MED ORDER — EPHEDRINE 5 MG/ML INJ
INTRAVENOUS | Status: AC
  Filled 2020-12-03: qty 10

## 2020-12-03 MED ORDER — FENTANYL CITRATE (PF) 100 MCG/2ML IJ SOLN
25.0000 ug | INTRAMUSCULAR | Status: DC | PRN
Start: 2020-12-03 — End: 2020-12-03

## 2020-12-03 MED ORDER — CIPROFLOXACIN IN D5W 400 MG/200ML IV SOLN
400.0000 mg | INTRAVENOUS | Status: AC
  Administered 2020-12-03: 400 mg via INTRAVENOUS

## 2020-12-03 MED ORDER — MEPERIDINE HCL 25 MG/ML IJ SOLN: 6.2500 mg | INTRAMUSCULAR | Status: DC | PRN

## 2020-12-03 MED ORDER — ACETAMINOPHEN 325 MG PO TABS
325.0000 mg | ORAL_TABLET | Freq: Once | ORAL | Status: DC | PRN
Start: 2020-12-03 — End: 2020-12-03

## 2020-12-03 MED ORDER — CIPROFLOXACIN IN D5W 400 MG/200ML IV SOLN
INTRAVENOUS | Status: AC
  Filled 2020-12-03: qty 200

## 2020-12-03 MED ORDER — MIDAZOLAM HCL 5 MG/5ML IJ SOLN
INTRAMUSCULAR | Status: DC | PRN
  Administered 2020-12-03: 2 mg via INTRAVENOUS

## 2020-12-03 MED ORDER — GLYCOPYRROLATE PF 0.2 MG/ML IJ SOSY
PREFILLED_SYRINGE | INTRAMUSCULAR | Status: AC
  Filled 2020-12-03: qty 1

## 2020-12-03 MED ORDER — LACTATED RINGERS IV SOLN: INTRAVENOUS | Status: DC

## 2020-12-03 MED ORDER — CEPHALEXIN 500 MG PO CAPS
500.0000 mg | ORAL_CAPSULE | Freq: Every day | ORAL | 0 refills | Status: AC
Start: 2020-12-03 — End: 2020-12-13

## 2020-12-03 MED ORDER — FLEET ENEMA 7-19 GM/118ML RE ENEM: 1.0000 | ENEMA | Freq: Once | RECTAL | Status: DC

## 2020-12-03 MED ORDER — MIDAZOLAM HCL 2 MG/2ML IJ SOLN
INTRAMUSCULAR | Status: AC
  Filled 2020-12-03: qty 2

## 2020-12-03 MED ORDER — FENTANYL CITRATE (PF) 100 MCG/2ML IJ SOLN
INTRAMUSCULAR | Status: DC | PRN
  Administered 2020-12-03 (×3): 25 ug via INTRAVENOUS
  Administered 2020-12-03: 100 ug via INTRAVENOUS
  Administered 2020-12-03: 25 ug via INTRAVENOUS
  Administered 2020-12-03: 50 ug via INTRAVENOUS

## 2020-12-03 MED ORDER — ACETAMINOPHEN 10 MG/ML IV SOLN: 1000.0000 mg | Freq: Once | INTRAVENOUS | Status: DC | PRN

## 2020-12-03 MED ORDER — FENTANYL CITRATE (PF) 250 MCG/5ML IJ SOLN
INTRAMUSCULAR | Status: AC
  Filled 2020-12-03: qty 5

## 2020-12-03 SURGICAL SUPPLY — 36 items
BAG DRN RND TRDRP ANRFLXCHMBR (UROLOGICAL SUPPLIES) ×2
BAG URINE DRAIN 2000ML AR STRL (UROLOGICAL SUPPLIES) ×3 IMPLANT
BLADE CLIPPER SENSICLIP SURGIC (BLADE) ×3 IMPLANT
CATH FOLEY 2WAY SLVR  5CC 16FR (CATHETERS) ×3
CATH FOLEY 2WAY SLVR 5CC 16FR (CATHETERS) ×2 IMPLANT
CATH ROBINSON RED A/P 16FR (CATHETERS) IMPLANT
CATH ROBINSON RED A/P 20FR (CATHETERS) ×3 IMPLANT
CLOTH BEACON ORANGE TIMEOUT ST (SAFETY) ×3 IMPLANT
CNTNR URN SCR LID CUP LEK RST (MISCELLANEOUS) ×4 IMPLANT
CONT SPEC 4OZ STRL OR WHT (MISCELLANEOUS) ×6
COVER BACK TABLE 60X90IN (DRAPES) ×3 IMPLANT
COVER MAYO STAND STRL (DRAPES) ×3 IMPLANT
DRAPE C-ARM 35X43 STRL (DRAPES) ×3 IMPLANT
DRSG TEGADERM 4X4.75 (GAUZE/BANDAGES/DRESSINGS) ×3 IMPLANT
DRSG TEGADERM 8X12 (GAUZE/BANDAGES/DRESSINGS) ×3 IMPLANT
GAUZE SPONGE 4X4 12PLY STRL (GAUZE/BANDAGES/DRESSINGS) ×1 IMPLANT
GLOVE SURG ENC MOIS LTX SZ6.5 (GLOVE) ×9 IMPLANT
GLOVE SURG ENC MOIS LTX SZ7.5 (GLOVE) IMPLANT
GLOVE SURG ENC MOIS LTX SZ8 (GLOVE) ×6 IMPLANT
GLOVE SURG ORTHO LTX SZ8.5 (GLOVE) IMPLANT
GLOVE SURG POLYISO LF SZ6.5 (GLOVE) ×1 IMPLANT
GOWN STRL REUS W/TWL XL LVL3 (GOWN DISPOSABLE) ×3 IMPLANT
HOLDER FOLEY CATH W/STRAP (MISCELLANEOUS) ×3 IMPLANT
IMPL SPACEOAR VUE SYSTEM (Spacer) IMPLANT
IMPLANT SPACEOAR VUE SYSTEM (Spacer) ×3 IMPLANT
ISEED AGX100 ×63 IMPLANT
IV NS 1000ML (IV SOLUTION) ×3
IV NS 1000ML BAXH (IV SOLUTION) ×2 IMPLANT
KIT TURNOVER CYSTO (KITS) ×3 IMPLANT
MARKER SKIN DUAL TIP RULER LAB (MISCELLANEOUS) ×3 IMPLANT
PACK CYSTO (CUSTOM PROCEDURE TRAY) ×3 IMPLANT
SUT BONE WAX W31G (SUTURE) IMPLANT
SYR 10ML LL (SYRINGE) ×3 IMPLANT
TOWEL OR 17X26 10 PK STRL BLUE (TOWEL DISPOSABLE) ×3 IMPLANT
UNDERPAD 30X36 HEAVY ABSORB (UNDERPADS AND DIAPERS) ×6 IMPLANT
WATER STERILE IRR 500ML POUR (IV SOLUTION) ×3 IMPLANT

## 2020-12-03 NOTE — Progress Notes (Signed)
  Radiation Oncology         [phone] ________________________________  Name: Danny Vincent MRN: [id number]  Date: 12/03/2020  DOB: [birth year]       Prostate Seed Implant  Danny Vincent, Danny Oyster, DO  No ref. provider found  DIAGNOSIS:  60 y.o. gentleman with Stage T1c adenocarcinoma of the prostate with Gleason score of 3+4, and PSA of 9.97  Oncology History  Malignant neoplasm of prostate ([place])  08/22/2020 Cancer Staging   Staging form: Prostate, AJCC 8th Edition - Clinical stage from 08/22/2020: Stage IIB (cT1c, cN0, cM0, PSA: 10, Grade Group: 2) - Signed by Freeman Caldron, PA-C on 10/15/2020   10/15/2020 Initial Diagnosis   Malignant neoplasm of prostate ([place])     PROCEDURE: Insertion of radioactive I-125 seeds into the prostate gland.  RADIATION DOSE: 145 Gy, definitive therapy.  TECHNIQUE: [name] was brought to the operating room with the urologist. He was placed in the dorsolithotomy position. He was catheterized and a rectal tube was inserted. The perineum was shaved, prepped and draped. The ultrasound probe was then introduced into the rectum to see the prostate gland.  TREATMENT DEVICE: A needle grid was attached to the ultrasound probe stand and anchor needles were placed.  3D PLANNING: The prostate was imaged in 3D using a sagittal sweep of the prostate probe. These images were transferred to the planning computer. There, the prostate, urethra and rectum were defined on each axial reconstructed image. Then, the software created an optimized 3D plan and a few seed positions were adjusted. The quality of the plan was reviewed using [facility] information for the target and the following two organs at risk:  Urethra and Rectum.  Then the accepted plan was printed and handed off to the radiation therapist.  Under my supervision, the custom loading of the seeds and spacers was carried out and loaded into sealed vicryl sleeves.  These pre-loaded needles were then placed  into the needle holder.Marland Kitchen  PROSTATE VOLUME STUDY:  Using transrectal ultrasound the volume of the prostate was verified to be 25.6 cc.  SPECIAL TREATMENT PROCEDURE/SUPERVISION AND HANDLING: The pre-loaded needles were then delivered under sagittal guidance. A total of 19 needles were used to deposit 66 seeds in the prostate gland. The individual seed activity was 0.332 mCi.  SpaceOAR:  Yes  COMPLEX SIMULATION: At the end of the procedure, an anterior radiograph of the pelvis was obtained to document seed positioning and count. Cystoscopy was performed to check the urethra and bladder.  A seed strand was visible on the right side of the verumontanum, and this was removed by Dr. Claudia Desanctis via the cystoscope.  The strand was identified as needle F3 containing three seeds.  This needle was subsequently deleted from the intraop plan and the dosimetry remained excellent in terms of prostate coverage, and sparing urethra/rectum.  So, ultimately, only 63 seeds were implanted.  MICRODOSIMETRY: At the end of the procedure, the patient was emitting 0.047 mR/hr at 1 meter. Accordingly, he was considered safe for hospital discharge.  PLAN: The patient will return to the radiation oncology clinic for post implant CT dosimetry in three weeks.   ________________________________  Sheral Apley Tammi Klippel, M.D.

## 2020-12-03 NOTE — Transfer of Care (Signed)
Immediate Anesthesia Transfer of Care Note  Patient: [place]  Procedure(s) Performed: RADIOACTIVE SEED IMPLANT/BRACHYTHERAPY IMPLANT (N/A Prostate) SPACE OAR INSTILLATION (N/A Rectum)  Patient Location: PACU  Anesthesia Type:General  Level of Consciousness: awake, alert , oriented and patient cooperative  Airway & Oxygen Therapy: Patient Spontanous Breathing  Post-op Assessment: Report given to RN and Post -op Vital signs reviewed and stable  Post vital signs: Reviewed and stable  Last Vitals:  Vitals Value Taken Time  BP 121/82 12/03/20 1436  Temp    Pulse 86 12/03/20 1440  Resp 11 12/03/20 1440  SpO2 95 % 12/03/20 1440  Vitals shown include unvalidated device data.  Last Pain:  Vitals:   12/03/20 1055  TempSrc: Oral  PainSc: 0-No pain      Patients Stated Pain Goal: 4 ([id number])  Complications: No complications documented.

## 2020-12-03 NOTE — Op Note (Signed)
PATIENT:  Danny Vincent  PRE-OPERATIVE DIAGNOSIS:  Adenocarcinoma of the prostate  POST-OPERATIVE DIAGNOSIS:  Same  PROCEDURE:  1. I-125 radioactive seed implantation 2. Cystoscopy  3. Placement of SpaceOAR 4. Fluoroscopy use with time less than 1 hour.  SURGEON: Dr. Jacalyn Lefevre  Radiation oncologist: Dr. Tyler Pita  ANESTHESIA:  General  EBL:  Minimal  DRAINS: None  INDICATION: [name]  Description of procedure: After informed consent the patient was brought to the major OR, placed on the table and administered general anesthesia. He was then moved to the modified lithotomy position with his perineum perpendicular to the floor. His perineum and genitalia were then sterilely prepped. An official timeout was then performed. A 16 French Foley catheter was then placed in the bladder and filled with dilute contrast, a rectal tube was placed in the rectum and the transrectal ultrasound probe was placed in the rectum and affixed to the stand. He was then sterilely draped.  Real time ultrasonography was used along with the seed planning software [employer] Prostate vs. [id number]. This was used to develop the seed plan including the number of needles as well as number of seeds required for complete and adequate coverage.  The needles were then preloaded with seeds and spacers according to the previously developed plan.  Real-time ultrasonography was then used along with the previously developed plan to implant a total of 63 seeds using 19 needles. This proceeded without difficulty or complication.   I then proceeded with placement of SpaceOAR by introducing a needle with the bevel angled inferiorly approximately 2 cm superior to the anus. This was angled downward and under direct ultrasound was placed within the space between the prostatic capsule and rectum. This was confirmed with a small amount of sterile saline injected and this was performed under direct ultrasound. I then  attached the SpaceOAR to the needle and injected this in the space between the prostate and rectum with good placement noted.  A Foley catheter was then removed as well as the transrectal ultrasound probe and rectal probe. Flexible cystoscopy was then performed using the 17 French flexible scope which revealed a normal urethra throughout its length down to the sphincter which appeared intact. The prostatic urethra revealed bilobar hypertrophy but with seeds seen on the right-hand side.  A grasper was then used to remove 3 seeds without difficulty.  The bladder was then entered and fully and systematically inspected. The ureteral orifices were noted to be of normal configuration and position. The mucosa revealed no evidence of tumors.  There was a small area of erythema at the posterior bladder wall possibly from catheter placement earlier in the procedure.  There were also no stones identified within the bladder. I noted no seeds or spacers on the floor of the bladder and retroflexion of the scope revealed no seeds protruding from the base of the prostate.  C-arm fluoroscopy was then used to evaluate the distribution of seeds placement and aid in determining confirmation of the number of seeds placed.  Real-time fluoroscopy was used with saved images revealing the location of the seeds placed both in AP and oblique views.  The cystoscope was then removed and the patient was awakened and taken to recovery room in stable and satisfactory condition. He tolerated procedure well and there were no intraoperative complications.

## 2020-12-03 NOTE — Anesthesia Procedure Notes (Signed)
Procedure Name: LMA Insertion Date/Time: 12/03/2020 12:49 PM Performed by: Rogers Blocker, CRNA Pre-anesthesia Checklist: Patient identified, Emergency Drugs available, Suction available and Patient being monitored Patient Re-evaluated:Patient Re-evaluated prior to induction Oxygen Delivery Method: Circle System Utilized Preoxygenation: Pre-oxygenation with 100% oxygen Induction Type: IV induction Ventilation: Mask ventilation without difficulty LMA: LMA inserted LMA Size: 4.0 Number of attempts: 2 (Attempted 5.0 LMA, small mouth opening-unable to pass.) Placement Confirmation: positive ETCO2 Tube secured with: Tape Dental Injury: Teeth and Oropharynx as per pre-operative assessment

## 2020-12-03 NOTE — Anesthesia Preprocedure Evaluation (Addendum)
Anesthesia Evaluation  Patient identified by MRN, date of birth, ID band Patient awake    Reviewed: Allergy & Precautions, NPO status , Patient's Chart, lab work & pertinent test results  Airway Mallampati: II   Neck ROM: Full  Mouth opening: Limited Mouth Opening  Dental  (+) Teeth Intact, Dental Advisory Given   Pulmonary neg pulmonary ROS,    breath sounds clear to auscultation       Cardiovascular hypertension, Pt. on medications  Rhythm:Regular Rate:Normal     Neuro/Psych negative neurological ROS  negative psych ROS   GI/Hepatic negative GI ROS, Neg liver ROS,   Endo/Other  diabetes  Renal/GU negative Renal ROS     Musculoskeletal   Abdominal Normal abdominal exam  (+)   Peds  Hematology   Anesthesia Other Findings - HLD  Reproductive/Obstetrics                            Anesthesia Physical Anesthesia Plan  ASA: III  Anesthesia Plan: General   Post-op Pain Management:    Induction: Intravenous  PONV Risk Score and Plan: 3 and Ondansetron, Dexamethasone and Midazolam  Airway Management Planned: LMA  Additional Equipment: None  Intra-op Plan:   Post-operative Plan: Extubation in OR  Informed Consent: I have reviewed the patients History and Physical, chart, labs and discussed the procedure including the risks, benefits and alternatives for the proposed anesthesia with the patient or authorized representative who has indicated his/her understanding and acceptance.     Dental advisory given  Plan Discussed with: CRNA  Anesthesia Plan Comments:        Anesthesia Quick Evaluation

## 2020-12-03 NOTE — Interval H&P Note (Signed)
History and Physical Interval Note:  12/03/2020 11:06 AM  Danny Vincent  has presented today for surgery, with the diagnosis of PROSTATE CANCER.  The various methods of treatment have been discussed with the patient and family. After consideration of risks, benefits and other options for treatment, the patient has consented to  Procedure(s) with comments: RADIOACTIVE SEED IMPLANT/BRACHYTHERAPY IMPLANT (N/A) - 90 MINS SPACE OAR INSTILLATION (N/A) as a surgical intervention.  The patient's history has been reviewed, patient examined, no change in status, stable for surgery.  I have reviewed the patient's chart and labs.  Questions were answered to the patient's satisfaction.     Adael Culbreath D Audrianna Driskill

## 2020-12-03 NOTE — Discharge Instructions (Signed)
DISCHARGE INSTRUCTIONS FOR PROSTATE SEED IMPLANTATION  Removal of catheter Remove the foley catheter after 24 hours ( day after the procedure).can be done easily by cutting the side port of the catheter, which will allow the balloon to deflate.  You will see 1-2 teaspoons of clear water as the balloon deflates and then the catheter can be slid out without difficulty.        Cut here  Antibiotics You may be given a prescription for an antibiotic to take when you arrive home. If so, be sure to take every tablet in the bottle, even if you are feeling better before the prescription is finished. If you begin itching, notice a rash or start to swell on your trunk, arms, legs and/or throat, immediately stop taking the antibiotic and call your Urologist. Diet Resume your usual diet when you return home. To keep your bowels moving easily and softly, drink prune, apple and cranberry juice at room temperature. You may also take a stool softener, such as Colace, which is available without prescription at local pharmacies. Daily activities ? No driving or heavy lifting for at least two days after the implant. ? No bike riding, horseback riding or riding lawn mowers for the first month after the implant. ? Any strenuous physical activity should be approved by your doctor before you resume it. Sexual relations You may resume sexual relations two weeks after the procedure. A condom should be used for the first two weeks. Your semen may be dark brown or black; this is normal and is related bleeding that may have occurred during the implant. Postoperative swelling Expect swelling and bruising of the scrotum and perineum (the area between the scrotum and anus). Both the swelling and the bruising should resolve in l or 2 weeks. Ice packs and over- the-counter medications such as Tylenol, Advil or Aleve may lessen your discomfort. Postoperative urination Most men experience burning on urination and/or urinary  frequency. If this becomes bothersome, contact your Urologist.  Medication can be prescribed to relieve these problems.  It is normal to have some blood in your urine for a few days after the implant. Special instructions related to the seeds It is unlikely that you will pass an Iodine-125 seed in your urine. The seeds are silver in color and are about as large as a grain of rice. If you pass a seed, do not handle it with your fingers. Use a spoon to place it in an envelope or jar in place this in base occluded area such as the garage or basement for return to the radiation clinic at your convenience.  Contact your doctor for ? Temperature greater than 101 F ? Increasing pain ? Inability to urinate Follow-up  You should have follow up with your urologist and radiation oncologist about 3 weeks after the procedure. General information regarding Iodine seeds ? Iodine-125 is a low energy radioactive material. It is not deeply penetrating and loses energy at short distances. Your prostate will absorb the radiation. Objects that are touched or used by the patient do not become radioactive. ? Body wastes (urine and stool) or body fluids (saliva, tears, semen or blood) are not radioactive. ? The Nuclear Regulatory Commission [facility]) has determined that no radiation precautions are needed for patients undergoing Iodine-125 seed implantation. The [facility] states that such patients do not present a risk to the people around them, including young children and pregnant women. However, in keeping with the general principle that radiation exposure should be kept as  low reasonably possible, we suggest the following: ? Children and pets should not sit on the patient's lap for the first two (2) weeks after the implant. ? Pregnant (or possibly pregnant) women should avoid prolonged, close contact with the patient for the first two (2) weeks after the implant. ? A distance of three (3) feet is acceptable. ? At a distance of  three (3) feet, there is no limit to the length of time anyone can be with the patient.  Post Anesthesia Home Care Instructions  Activity: Get plenty of rest for the remainder of the day. A responsible adult should stay with you for 24 hours following the procedure.  For the next 24 hours, DO NOT: -Drive a car -Paediatric nurse -Drink alcoholic beverages -Take any medication unless instructed by your physician -Make any legal decisions or sign important papers.  Meals: Start with liquid foods such as gelatin or soup. Progress to regular foods as tolerated. Avoid greasy, spicy, heavy foods. If nausea and/or vomiting occur, drink only clear liquids until the nausea and/or vomiting subsides. Call your physician if vomiting continues.  Special Instructions/Symptoms: Your throat may feel dry or sore from the anesthesia or the breathing tube placed in your throat during surgery. If this causes discomfort, gargle with warm salt water. The discomfort should disappear within 24 hours.  If you had a scopolamine patch placed behind your ear for the management of post- operative nausea and/or vomiting:  1. The medication in the patch is effective for 72 hours, after which it should be removed.  Wrap patch in a tissue and discard in the trash. Wash hands thoroughly with soap and water. 2. You may remove the patch earlier than 72 hours if you experience unpleasant side effects which may include dry mouth, dizziness or visual disturbances. 3. Avoid touching the patch. Wash your hands with soap and water after contact with the patch.    Indwelling Urinary Catheter Care, Adult An indwelling urinary catheter is a thin tube that is put into your bladder. The tube helps to drain pee (urine) out of your body. The tube goes in through your urethra. Your urethra is where pee comes out of your body. Your pee will come out through the catheter, then it will go into a bag (drainage bag). Take good care of your  catheter so it will work well. How to wear your catheter and bag Supplies needed  Sticky tape (adhesive tape) or a leg strap.  Alcohol wipe or soap and water (if you use tape).  A clean towel (if you use tape).  Large overnight bag.  Smaller bag (leg bag). Wearing your catheter Attach your catheter to your leg with tape or a leg strap.  Make sure the catheter is not pulled tight.  If a leg strap gets wet, take it off and put on a dry strap.  If you use tape to hold the bag on your leg: 1. Use an alcohol wipe or soap and water to wash your skin where the tape made it sticky before. 2. Use a clean towel to pat-dry that skin. 3. Use new tape to make the bag stay on your leg. Wearing your bags You should have been given a large overnight bag.  You may wear the overnight bag in the day or night.  Always have the overnight bag lower than your bladder.  Do not let the bag touch the floor.  Before you go to sleep, put a clean plastic bag in a  wastebasket. Then hang the overnight bag inside the wastebasket. You should also have a smaller leg bag that fits under your clothes.  Always wear the leg bag below your knee.  Do not wear your leg bag at night. How to care for your skin and catheter Supplies needed  A clean washcloth.  Water and mild soap.  A clean towel. Caring for your skin and catheter  Clean the skin around your catheter every day: 1. Wash your hands with soap and water. 2. Wet a clean washcloth in warm water and mild soap. 3. Clean the skin around your urethra.  If you are male:  Gently spread the folds of skin around your vagina (labia).  With the washcloth in your other hand, wipe the inner side of your labia on each side. Wipe from front to back.  If you are male:  Pull back any skin that covers the end of your penis (foreskin).  With the washcloth in your other hand, wipe your penis in small circles. Start wiping at the tip of your penis, then  move away from the catheter.  Move the foreskin back in place, if needed. 4. With your free hand, hold the catheter close to where it goes into your body.  Keep holding the catheter during cleaning so it does not get pulled out. 5. With the washcloth in your other hand, clean the catheter.  Only wipe downward on the catheter.  Do not wipe upward toward your body. Doing this may push germs into your urethra and cause infection. 6. Use a clean towel to pat-dry the catheter and the skin around it. Make sure to wipe off all soap. 7. Wash your hands with soap and water.  Shower every day. Do not take baths.  Do not use cream, ointment, or lotion on the area where the catheter goes into your body, unless your doctor tells you to.  Do not use powders, sprays, or lotions on your genital area.  Check your skin around the catheter every day for signs of infection. Check for: ? Redness, swelling, or pain. ? Fluid or blood. ? Warmth. ? Pus or a bad smell.      How to empty the bag Supplies needed  Rubbing alcohol.  Gauze pad or cotton ball.  Tape or a leg strap. Emptying the bag Pour the pee out of your bag when it is ?- full, or at least 2-3 times a day. Do this for your overnight bag and your leg bag. 1. Wash your hands with soap and water. 2. Separate (detach) the bag from your leg. 3. Hold the bag over the toilet or a clean pail. Keep the bag lower than your hips and bladder. This is so the pee (urine) does not go back into the tube. 4. Open the pour spout. It is at the bottom of the bag. 5. Empty the pee into the toilet or pail. Do not let the pour spout touch any surface. 6. Put rubbing alcohol on a gauze pad or cotton ball. 7. Use the gauze pad or cotton ball to clean the pour spout. 8. Close the pour spout. 9. Attach the bag to your leg with tape or a leg strap. 10. Wash your hands with soap and water. Follow instructions for cleaning the drainage bag:  From the product  maker.  As told by your doctor. How to change the bag Supplies needed  Alcohol wipes.  A clean bag.  Tape or a leg strap. Changing  the bag Replace your bag when it starts to leak, smell bad, or look dirty. 1. Wash your hands with soap and water. 2. Separate the dirty bag from your leg. 3. Pinch the catheter with your fingers so that pee does not spill out. 4. Separate the catheter tube from the bag tube where these tubes connect (at the connection valve). Do not let the tubes touch any surface. 5. Clean the end of the catheter tube with an alcohol wipe. Use a different alcohol wipe to clean the end of the bag tube. 6. Connect the catheter tube to the tube of the clean bag. 7. Attach the clean bag to your leg with tape or a leg strap. Do not make the bag tight on your leg. 8. Wash your hands with soap and water. General rules  Never pull on your catheter. Never try to take it out. Doing that can hurt you.  Always wash your hands before and after you touch your catheter or bag. Use a mild, fragrance-free soap. If you do not have soap and water, use hand sanitizer.  Always make sure there are no twists or bends (kinks) in the catheter tube.  Always make sure there are no leaks in the catheter or bag.  Drink enough fluid to keep your pee pale yellow.  Do not take baths, swim, or use a hot tub.  If you are male, wipe from front to back after you poop (have a bowel movement).   Contact a doctor if:  Your pee is cloudy.  Your pee smells worse than usual.  Your catheter gets clogged.  Your catheter leaks.  Your bladder feels full. Get help right away if:  You have redness, swelling, or pain where the catheter goes into your body.  You have fluid, blood, pus, or a bad smell coming from the area where the catheter goes into your body.  Your skin feels warm where the catheter goes into your body.  You have a fever.  You have pain in your: ? Belly  (abdomen). ? Legs. ? Lower back. ? Bladder.  You see blood in the catheter.  Your pee is pink or red.  You feel sick to your stomach (nauseous).  You throw up (vomit).  You have chills.  Your pee is not draining into the bag.  Your catheter gets pulled out. Summary  An indwelling urinary catheter is a thin tube that is placed into the bladder to help drain pee (urine) out of the body.  The catheter is placed into the part of the body that drains pee from the bladder (urethra).  Taking good care of your catheter will keep it working properly and help prevent problems.  Always wash your hands before and after touching your catheter or bag.  Never pull on your catheter or try to take it out. This information is not intended to replace advice given to you by your health care provider. Make sure you discuss any questions you have with your health care provider. Document Revised: 12/30/2018 Document Reviewed: 04/23/2017 Elsevier Patient Education  [employer].

## 2020-12-04 ENCOUNTER — Encounter (HOSPITAL_BASED_OUTPATIENT_CLINIC_OR_DEPARTMENT_OTHER): Payer: Self-pay | Admitting: Urology

## 2020-12-05 NOTE — Anesthesia Postprocedure Evaluation (Signed)
Anesthesia Post Note  Patient: Carrick  Procedure(s) Performed: RADIOACTIVE SEED IMPLANT/BRACHYTHERAPY IMPLANT (N/A Prostate) SPACE OAR INSTILLATION (N/A Rectum)     Patient location during evaluation: PACU Anesthesia Type: General Level of consciousness: awake and alert Pain management: pain level controlled Vital Signs Assessment: post-procedure vital signs reviewed and stable Respiratory status: spontaneous breathing, nonlabored ventilation, respiratory function stable and patient connected to nasal cannula oxygen Cardiovascular status: blood pressure returned to baseline and stable Postop Assessment: no apparent nausea or vomiting Anesthetic complications: no   No complications documented.  Last Vitals:  Vitals:   12/03/20 1515 12/03/20 1615  BP: 125/76 124/74  Pulse: 88 82  Resp: (!) 21 20  Temp:  36.7 C  SpO2: 93% 96%    Last Pain:  Vitals:   12/04/20 1343  TempSrc:   PainSc: 0-No pain                 Effie Berkshire

## 2020-12-24 ENCOUNTER — Other Ambulatory Visit: Payer: Self-pay | Admitting: Family Medicine

## 2020-12-24 DIAGNOSIS — I10 Essential (primary) hypertension: Secondary | ICD-10-CM

## 2021-01-07 ENCOUNTER — Telehealth: Payer: Self-pay | Admitting: *Deleted

## 2021-01-07 NOTE — Telephone Encounter (Signed)
CALLED PATIENT TO REMIND OF POST SEED APPTS. FOR 01-08-21, SPOKE WITH PATIENT AND HE IS AWARE OF THESE APPTS.

## 2021-01-08 ENCOUNTER — Ambulatory Visit
Admission: RE | Admit: 2021-01-08 | Discharge: 2021-01-08 | Disposition: A | Discharge status: Home / Self Care | Payer: Managed Care, Other (non HMO) | Source: Ambulatory Visit | Attending: Radiation Oncology | Admitting: Radiation Oncology

## 2021-01-08 ENCOUNTER — Encounter: Payer: Self-pay | Admitting: Urology

## 2021-01-08 ENCOUNTER — Ambulatory Visit
Admission: RE | Admit: 2021-01-08 | Discharge: 2021-01-08 | Disposition: A | Discharge status: Home / Self Care | Payer: Managed Care, Other (non HMO) | Source: Ambulatory Visit | Attending: Urology | Admitting: Urology

## 2021-01-08 ENCOUNTER — Other Ambulatory Visit: Payer: Self-pay

## 2021-01-08 VITALS — BP 140/86 | HR 58 | Temp 98.0°F | Resp 18 | Ht 68.5 in | Wt 200.6 lb

## 2021-01-08 DIAGNOSIS — Z79899 Other long term (current) drug therapy: Secondary | ICD-10-CM | POA: Insufficient documentation

## 2021-01-08 DIAGNOSIS — C61 Malignant neoplasm of prostate: Secondary | ICD-10-CM | POA: Diagnosis present

## 2021-01-08 DIAGNOSIS — Z923 Personal history of irradiation: Secondary | ICD-10-CM | POA: Insufficient documentation

## 2021-01-08 NOTE — Progress Notes (Signed)
  Radiation Oncology         [phone] ________________________________  Name: Danny Vincent MRN: [id number]  Date: 01/08/2021  DOB: 1960/10/16  COMPLEX SIMULATION NOTE  NARRATIVE:  The patient was brought to the [facility] today following prostate seed implantation approximately one month ago.  Identity was confirmed.  All relevant records and images related to the planned course of therapy were reviewed.  Then, the patient was set-up supine.  CT images were obtained.  The CT images were loaded into the planning software.  Then the prostate and rectum were contoured.  Treatment planning then occurred.  The implanted iodine 125 seeds were identified by the physics staff for projection of radiation distribution  I have requested : 3D Simulation  I have requested a DVH of the following structures: Prostate and rectum.    ________________________________  Sheral Apley Tammi Klippel, M.D.

## 2021-01-08 NOTE — Progress Notes (Signed)
Radiation Oncology         ([phone]) [phone] ________________________________  Name: Danny Vincent MRN: [id number]  Date: 01/08/2021  DOB: [birth year]  Post-Seed Follow-Up Visit Note  CC: Shelda Pal, DO  Robley Fries, MD  Diagnosis:   60 y.o. gentleman with Stage T1c adenocarcinoma of the prostate with Gleason Score of 3+4, and PSA of 9.97.    ICD-10-CM   1. Malignant neoplasm of prostate (HCC)  C61     Interval Since Last Radiation:  5 weeks 12/03/20:  Insertion of radioactive I-125 seeds into the prostate gland; 145 Gy, definitive therapy with placement of SpaceOAR gel.  Narrative:  The patient returns today for routine follow-up.  He is complaining of increased urinary frequency and urinary hesitation symptoms. He filled out a questionnaire regarding urinary function today providing and overall IPSS score of 5 characterizing his symptoms as mild with slightly weaker flow of stream, nocturia x2 and mild increased urinary frequency.  His pre-implant score was 2.  He specifically denies gross hematuria, dysuria, straining to void, incomplete bladder emptying or incontinence.  He denies any abdominal pain or bowel symptoms and reports a healthy appetite, maintaining his weight.  He has not noticed any significant impact on his energy level and has continue to remain active.  Overall, he is quite pleased with his progress to date.  ALLERGIES:  has No Known Allergies.  Meds: Current Outpatient Medications  Medication Sig Dispense Refill  . amLODipine-valsartan (EXFORGE) 5-320 MG tablet TAKE 1 TABLET BY MOUTH DAILY 90 tablet 2  . Cyanocobalamin (VITAMIN B-12 PO) Take by mouth daily.    Marland Kitchen ECHINACEA PO Take by mouth daily.    Marland Kitchen GINSENG PO Take by mouth daily.    Marland Kitchen HYDROcodone-acetaminophen (NORCO/VICODIN) 5-325 MG tablet Take 1 tablet by mouth every 4 (four) hours as needed for moderate pain. 20 tablet 0  . Multiple Vitamins-Minerals (ZINC PO) Take by mouth daily.    .  rosuvastatin (CRESTOR) 20 MG tablet Take 1 tablet (20 mg total) by mouth daily. (Patient taking differently: Take 20 mg by mouth daily. Takes 1200 pm) 90 tablet 3  . Turmeric (QC TUMERIC COMPLEX PO) Take by mouth in the morning, at noon, and at bedtime.    Marland Kitchen VITAMIN D PO Take by mouth daily.     No current facility-administered medications for this encounter.    Physical Findings: In general this is a well appearing African-American male in no acute distress. He's alert and oriented x4 and appropriate throughout the examination. Cardiopulmonary assessment is negative for acute distress and he exhibits normal effort.   Lab Findings: Lab Results  Component Value Date   WBC 9.5 11/29/2020   HGB 13.3 11/29/2020   HCT 41.4 11/29/2020   MCV 83.5 11/29/2020   PLT 280 11/29/2020    Radiographic Findings:  Patient underwent CT imaging in our clinic for post implant dosimetry. The CT will be reviewed by Dr. Tammi Klippel to confirm there is an adequate distribution of radioactive seeds throughout the prostate gland and ensure that there are no seeds in or near the rectum.  We suspect the final radiation plan and dosimetry will show appropriate coverage of the prostate gland. He understands that we will call and inform him of any unexpected findings on further review of his imaging and dosimetry.  Impression/Plan: 60 y.o. gentleman with Stage T1c adenocarcinoma of the prostate with Gleason Score of 3+4, and PSA of 9.97. The patient is recovering from the effects of radiation.  His urinary symptoms should gradually improve over the next 4-6 months. We talked about this today. He is encouraged by his improvement already and is otherwise pleased with his outcome. We also talked about long-term follow-up for prostate cancer following seed implant. He understands that ongoing PSA determinations and digital rectal exams will help perform surveillance to rule out disease recurrence. He has a follow up appointment  scheduled with Dr. Claudia Desanctis in June 2022. He understands what to expect with his PSA measures. Patient was also educated today about some of the long-term effects from radiation including a small risk for rectal bleeding and possibly erectile dysfunction. We talked about some of the general management approaches to these potential complications. However, I did encourage the patient to contact our office or return at any point if he has questions or concerns related to his previous radiation and prostate cancer.    Nicholos Johns, PA-C

## 2021-01-15 ENCOUNTER — Ambulatory Visit: Payer: Self-pay | Admitting: Urology

## 2021-01-15 ENCOUNTER — Ambulatory Visit: Payer: Managed Care, Other (non HMO) | Admitting: Radiation Oncology

## 2021-01-24 ENCOUNTER — Encounter: Payer: Self-pay | Admitting: Radiation Oncology

## 2021-01-24 ENCOUNTER — Ambulatory Visit
Admission: RE | Admit: 2021-01-24 | Discharge: 2021-01-24 | Disposition: A | Discharge status: Home / Self Care | Payer: Managed Care, Other (non HMO) | Source: Ambulatory Visit | Attending: Radiation Oncology | Admitting: Radiation Oncology

## 2021-01-24 DIAGNOSIS — C61 Malignant neoplasm of prostate: Secondary | ICD-10-CM | POA: Insufficient documentation

## 2021-01-27 NOTE — Progress Notes (Signed)
  Radiation Oncology         [phone]) [phone] ________________________________  Name: CORDARIOUS ZEEK MRN: [id number]  Date: 01/24/2021  DOB: 1961/05/01  3D Planning Note   Prostate Brachytherapy Post-Implant Dosimetry  Diagnosis: 60 y.o. gentleman with Stage T1c adenocarcinoma of the prostate with Gleason score of 3+4, and PSA of 9.97.  Narrative: On a previous date, ZEBULIN SIEGEL returned following prostate seed implantation for post implant planning. He underwent CT scan complex simulation to delineate the three-dimensional structures of the pelvis and demonstrate the radiation distribution.  Since that time, the seed localization, and complex isodose planning with dose volume histograms have now been completed.  Results:   Prostate Coverage - The dose of radiation delivered to the 90% or more of the prostate gland (D90) was 94.65% of the prescription dose. This exceeds our goal of greater than 90%. Rectal Sparing - The volume of rectal tissue receiving the prescription dose or higher was 0.0 cc. This falls under our thresholds tolerance of 1.0 cc.  Impression: The prostate seed implant appears to show adequate target coverage and appropriate rectal sparing.  Plan:  The patient will continue to follow with urology for ongoing PSA determinations. I would anticipate a high likelihood for local tumor control with minimal risk for rectal morbidity.  ________________________________  Sheral Apley Tammi Klippel, M.D.

## 2021-02-10 NOTE — Progress Notes (Signed)
  Radiation Oncology         [phone]) [phone] ________________________________  Name: Danny Vincent MRN: [id number]  Date: 01/24/2021  DOB: 04/06/1961  3D Planning Note   Prostate Brachytherapy Post-Implant Dosimetry  Diagnosis: 60 y.o. gentleman with Stage T1c adenocarcinoma of the prostate with Gleason score of 3+4, and PSA of 9.97.  Narrative: On a previous date, Danny Vincent returned following prostate seed implantation for post implant planning. He underwent CT scan complex simulation to delineate the three-dimensional structures of the pelvis and demonstrate the radiation distribution.  Since that time, the seed localization, and complex isodose planning with dose volume histograms have now been completed.  Results:   Prostate Coverage - The dose of radiation delivered to the 90% or more of the prostate gland (D90) was 94.65% of the prescription dose. This exceeds our goal of greater than 90%. Rectal Sparing - The volume of rectal tissue receiving the prescription dose or higher was 0.0 cc. This falls under our thresholds tolerance of 1.0 cc.  Impression: The prostate seed implant appears to show adequate target coverage and appropriate rectal sparing.  Plan:  The patient will continue to follow with urology for ongoing PSA determinations. I would anticipate a high likelihood for local tumor control with minimal risk for rectal morbidity.  ________________________________  Sheral Apley Tammi Klippel, M.D.

## 2021-03-03 ENCOUNTER — Ambulatory Visit (INDEPENDENT_AMBULATORY_CARE_PROVIDER_SITE_OTHER): Payer: Managed Care, Other (non HMO) | Admitting: Family Medicine

## 2021-03-03 ENCOUNTER — Other Ambulatory Visit: Payer: Self-pay

## 2021-03-03 ENCOUNTER — Encounter: Payer: Self-pay | Admitting: Family Medicine

## 2021-03-03 VITALS — BP 122/80 | HR 68 | Temp 98.6°F | Ht 68.5 in | Wt 199.2 lb

## 2021-03-03 DIAGNOSIS — E1165 Type 2 diabetes mellitus with hyperglycemia: Secondary | ICD-10-CM | POA: Diagnosis not present

## 2021-03-03 DIAGNOSIS — Z0000 Encounter for general adult medical examination without abnormal findings: Secondary | ICD-10-CM | POA: Diagnosis not present

## 2021-03-03 LAB — COMPREHENSIVE METABOLIC PANEL
ALT: 28 U/L (ref 0–53)
AST: 22 U/L (ref 0–37)
Albumin: 4.5 g/dL (ref 3.5–5.2)
Alkaline Phosphatase: 101 U/L (ref 39–117)
BUN: 15 mg/dL (ref 6–23)
CO2: 25 mEq/L (ref 19–32)
Calcium: 9.3 mg/dL (ref 8.4–10.5)
Chloride: 108 mEq/L (ref 96–112)
Creatinine, Ser: 1.02 mg/dL (ref 0.40–1.50)
GFR: 81.24 mL/min (ref >60.00)
Glucose, Bld: 146 mg/dL — ABNORMAL HIGH (ref 70–99)
Potassium: 3.6 mEq/L (ref 3.5–5.1)
Sodium: 144 mEq/L (ref 135–145)
Total Bilirubin: 0.6 mg/dL (ref 0.2–1.2)
Total Protein: 7.4 g/dL (ref 6.0–8.3)

## 2021-03-03 LAB — CBC
HCT: 40.3 % (ref 39.0–52.0)
Hemoglobin: 12.6 g/dL — ABNORMAL LOW (ref 13.0–17.0)
MCHC: 31.2 g/dL (ref 30.0–36.0)
MCV: 83 fl (ref 78.0–100.0)
Platelets: 235 10*3/uL (ref 150.0–400.0)
RBC: 4.85 Mil/uL (ref 4.22–5.81)
RDW: 16.8 % — ABNORMAL HIGH (ref 11.5–15.5)
WBC: 9.1 10*3/uL (ref 4.0–10.5)

## 2021-03-03 LAB — MICROALBUMIN / CREATININE URINE RATIO
Creatinine,U: 289.9 mg/dL
Microalb Creat Ratio: 0.4 mg/g (ref 0.0–30.0)
Microalb, Ur: 1.3 mg/dL (ref 0.0–1.9)

## 2021-03-03 LAB — LIPID PANEL
Cholesterol: 115 mg/dL (ref 0–200)
HDL: 36.5 mg/dL — ABNORMAL LOW (ref >39.00)
LDL Cholesterol: 57 mg/dL (ref 0–99)
NonHDL: 78.29
Total CHOL/HDL Ratio: 3
Triglycerides: 107 mg/dL (ref 0.0–149.0)
VLDL: 21.4 mg/dL (ref 0.0–40.0)

## 2021-03-03 LAB — HEMOGLOBIN A1C: Hgb A1c MFr Bld: 6.6 % — ABNORMAL HIGH (ref 4.6–6.5)

## 2021-03-03 NOTE — Patient Instructions (Signed)
Give us 2-3 business days to get the results of your labs back.   Keep the diet clean and stay active.  Let us know if you need anything. 

## 2021-03-03 NOTE — Progress Notes (Signed)
Chief Complaint  Patient presents with   Follow-up    Well Male Danny Vincent is here for a complete physical.   His last physical was >1 year ago.  Current diet: in general, a "healthy" diet.  Current exercise: cardio, lift weights Weight trend: stable Fatigue out of ordinary? No. Seat belt? Yes.     Health maintenance Shingrix- Yes Colonoscopy- Yes Tetanus- Yes HIV- Yes Hep C- Yes   Past Medical History:  Diagnosis Date   Hyperlipidemia    Hypertension    Prostate cancer ([place])    Wears glasses       Past Surgical History:  Procedure Laterality Date   NO PAST SURGERIES     PROSTATE BIOPSY     RADIOACTIVE SEED IMPLANT N/A 12/03/2020   Procedure: RADIOACTIVE SEED IMPLANT/BRACHYTHERAPY IMPLANT;  Surgeon: Robley Fries, MD;  Location: [place];  Service: Urology;  Laterality: N/A;  90 MINS   SPACE OAR INSTILLATION N/A 12/03/2020   Procedure: SPACE OAR INSTILLATION;  Surgeon: Robley Fries, MD;  Location: [facility];  Service: Urology;  Laterality: N/A;    Medications  Current Outpatient Medications on File Prior to Visit  Medication Sig Dispense Refill   amLODipine-valsartan (EXFORGE) 5-320 MG tablet TAKE 1 TABLET BY MOUTH DAILY 90 tablet 2   Cyanocobalamin (VITAMIN B-12 PO) Take by mouth daily.     ECHINACEA PO Take by mouth daily.     GINSENG PO Take by mouth daily.     Multiple Vitamins-Minerals (ZINC PO) Take by mouth daily.     rosuvastatin (CRESTOR) 20 MG tablet Take 1 tablet (20 mg total) by mouth daily. (Patient taking differently: Take 20 mg by mouth daily. Takes 1200 pm) 90 tablet 3   Turmeric (QC TUMERIC COMPLEX PO) Take by mouth in the morning, at noon, and at bedtime.     VITAMIN D PO Take by mouth daily.     Allergies No Known Allergies  Family History Family History  Problem Relation Age of Onset   Diabetes Mother    Hypertension Mother    Diabetes Father    Prostate cancer Father        dx late  88s-early 60s   Breast cancer Sister 38   Prostate cancer Paternal Uncle        mets, dx >50, d. 59s   Prostate cancer Paternal Uncle        dx >50, d. 50s   Prostate cancer Paternal Uncle        dx >68, d. 47s   Prostate cancer Paternal Uncle        dx >50, d. 53s   Lung cancer Maternal Uncle        dx >50, smoking hx   Prostate cancer Cousin 57   Esophageal cancer Neg Hx    Rectal cancer Neg Hx    Stomach cancer Neg Hx    Colon cancer Neg Hx     Review of Systems: Constitutional:  no fevers Eye:  no recent significant change in vision Ear/Nose/Mouth/Throat:  Ears:  no hearing loss Nose/Mouth/Throat:  no complaints of nasal congestion, no sore throat Cardiovascular:  no chest pain Respiratory:  no shortness of breath Gastrointestinal:  no change in bowel habits GU:  Male: negative for dysuria, frequency Musculoskeletal/Extremities:  no joint pain Integumentary (Skin/Breast):  no abnormal skin lesions reported Neurologic:  no headaches Endocrine: No unexpected weight changes Hematologic/Lymphatic:  no abnormal bleeding  Exam BP 122/80 (BP Location:  Left Arm, Patient Position: Sitting, Cuff Size: Normal)   Pulse 68   Temp 98.6 F (37 C) (Oral)   Ht 5' 8.5" (1.74 m)   Wt 199 lb 4 oz (90.4 kg)   SpO2 98%   BMI 29.86 kg/m  General:  well developed, well nourished, in no apparent distress Skin:  no significant moles, warts, or growths Head:  no masses, lesions, or tenderness Eyes:  pupils equal and round, sclera anicteric without injection Ears:  canals without lesions, TMs shiny without retraction, no obvious effusion, no erythema Nose:  nares patent, septum midline, mucosa normal Throat/Pharynx:  lips and gingiva without lesion; tongue and uvula midline; non-inflamed pharynx; no exudates or postnasal drainage Neck: neck supple without adenopathy, thyromegaly, or masses Cardiac: RRR, no bruits, no LE edema Lungs:  clear to auscultation, breath sounds equal  bilaterally, no respiratory distress Abdomen: BS+, soft, non-tender, non-distended, no masses or organomegaly noted Rectal: Deferred Musculoskeletal:  symmetrical muscle groups noted without atrophy or deformity Neuro:  gait normal; deep tendon reflexes normal and symmetric Psych: well oriented with normal range of affect and appropriate judgment/insight  Assessment and Plan  Well adult exam  Type 2 diabetes mellitus with hyperglycemia, without long-term current use of insulin (HCC) - Plan: CBC, Comprehensive metabolic panel, Lipid panel, Hemoglobin A1c, Microalbumin / creatinine urine ratio   Well 60 y.o. male. Counseled on diet and exercise. Pt follows with urology for prostate cancer. Covid booster rec'd.  Immunizations, labs, and further orders as above. Follow up in 6 mo or prn. The patient voiced understanding and agreement to the plan.  [place], DO 03/03/21 8:09 AM

## 2021-03-04 ENCOUNTER — Other Ambulatory Visit: Payer: Self-pay | Admitting: Family Medicine

## 2021-03-04 DIAGNOSIS — D649 Anemia, unspecified: Secondary | ICD-10-CM

## 2021-03-04 NOTE — Progress Notes (Signed)
cbc

## 2021-03-18 ENCOUNTER — Other Ambulatory Visit: Payer: Self-pay

## 2021-03-18 ENCOUNTER — Other Ambulatory Visit (INDEPENDENT_AMBULATORY_CARE_PROVIDER_SITE_OTHER): Payer: Managed Care, Other (non HMO)

## 2021-03-18 DIAGNOSIS — D649 Anemia, unspecified: Secondary | ICD-10-CM

## 2021-03-18 LAB — CBC
HCT: 38.2 % — ABNORMAL LOW (ref 39.0–52.0)
Hemoglobin: 12.4 g/dL — ABNORMAL LOW (ref 13.0–17.0)
MCHC: 32.4 g/dL (ref 30.0–36.0)
MCV: 83.1 fl (ref 78.0–100.0)
Platelets: 244 10*3/uL (ref 150.0–400.0)
RBC: 4.6 Mil/uL (ref 4.22–5.81)
RDW: 16.1 % — ABNORMAL HIGH (ref 11.5–15.5)
WBC: 7.2 10*3/uL (ref 4.0–10.5)

## 2021-03-19 ENCOUNTER — Other Ambulatory Visit: Payer: Managed Care, Other (non HMO)

## 2021-03-19 DIAGNOSIS — D649 Anemia, unspecified: Secondary | ICD-10-CM

## 2021-03-19 LAB — IBC + FERRITIN
Ferritin: 247.3 ng/mL (ref 22.0–322.0)
Iron: 71 ug/dL (ref 42–165)
Saturation Ratios: 24.1 % (ref 20.0–50.0)
Transferrin: 210 mg/dL — ABNORMAL LOW (ref 212.0–360.0)

## 2021-03-21 LAB — HGB FRACTIONATION CASCADE
Hgb A2: 2.8 % (ref 1.8–3.2)
Hgb A: 97.2 % (ref 96.4–98.8)
Hgb F: 0 % (ref 0.0–2.0)
Hgb S: 0 %

## 2021-03-23 ENCOUNTER — Other Ambulatory Visit: Payer: Self-pay | Admitting: Family Medicine

## 2021-08-24 ENCOUNTER — Other Ambulatory Visit: Payer: Self-pay | Admitting: Family Medicine

## 2021-08-24 DIAGNOSIS — I10 Essential (primary) hypertension: Secondary | ICD-10-CM

## 2021-09-02 ENCOUNTER — Encounter: Payer: Self-pay | Admitting: Family Medicine

## 2021-09-02 ENCOUNTER — Ambulatory Visit: Payer: Managed Care, Other (non HMO) | Admitting: Family Medicine

## 2021-09-02 VITALS — BP 118/76 | HR 58 | Temp 98.5°F | Ht 68.5 in | Wt 196.2 lb

## 2021-09-02 DIAGNOSIS — I10 Essential (primary) hypertension: Secondary | ICD-10-CM | POA: Diagnosis not present

## 2021-09-02 DIAGNOSIS — Z23 Encounter for immunization: Secondary | ICD-10-CM | POA: Diagnosis not present

## 2021-09-02 DIAGNOSIS — E1165 Type 2 diabetes mellitus with hyperglycemia: Secondary | ICD-10-CM | POA: Diagnosis not present

## 2021-09-02 LAB — COMPREHENSIVE METABOLIC PANEL
ALT: 16 U/L (ref 0–53)
AST: 14 U/L (ref 0–37)
Albumin: 4.2 g/dL (ref 3.5–5.2)
Alkaline Phosphatase: 84 U/L (ref 39–117)
BUN: 16 mg/dL (ref 6–23)
CO2: 26 mEq/L (ref 19–32)
Calcium: 9.2 mg/dL (ref 8.4–10.5)
Chloride: 106 mEq/L (ref 96–112)
Creatinine, Ser: 1.03 mg/dL (ref 0.40–1.50)
GFR: 80.01 mL/min (ref >60.00)
Glucose, Bld: 131 mg/dL — ABNORMAL HIGH (ref 70–99)
Potassium: 3.7 mEq/L (ref 3.5–5.1)
Sodium: 141 mEq/L (ref 135–145)
Total Bilirubin: 0.5 mg/dL (ref 0.2–1.2)
Total Protein: 7.1 g/dL (ref 6.0–8.3)

## 2021-09-02 LAB — LIPID PANEL
Cholesterol: 180 mg/dL (ref 0–200)
HDL: 37.4 mg/dL — ABNORMAL LOW (ref >39.00)
LDL Cholesterol: 122 mg/dL — ABNORMAL HIGH (ref 0–99)
NonHDL: 142.52
Total CHOL/HDL Ratio: 5
Triglycerides: 104 mg/dL (ref 0.0–149.0)
VLDL: 20.8 mg/dL (ref 0.0–40.0)

## 2021-09-02 LAB — HEMOGLOBIN A1C: Hgb A1c MFr Bld: 6.8 % — ABNORMAL HIGH (ref 4.6–6.5)

## 2021-09-02 MED ORDER — ROSUVASTATIN CALCIUM 20 MG PO TABS: ORAL_TABLET | ORAL | 3 refills | Status: DC

## 2021-09-02 NOTE — Patient Instructions (Addendum)
Give [place] 2-3 business days to get the results of your labs back.   Keep the diet clean and stay active.  I recommend getting the updated bivalent covid vaccination booster at your convenience.   Let us know if you need anything.

## 2021-09-02 NOTE — Addendum Note (Signed)
Addended by: Sharon Seller B on: 09/02/2021 08:22 AM   Modules accepted: Orders

## 2021-09-02 NOTE — Progress Notes (Signed)
Subjective:   Chief Complaint  Patient presents with   Follow-up    Danny Vincent is a 60 y.o. male here for follow-up of diabetes.   Doyce does not routinely check his sugars.  Patient does not require insulin.   Medications include: diet controlled Diet is overall healthy.  Exercise: lifting weights, elliptical machine  Hypertension Patient presents for hypertension follow up. He does not monitor home blood pressures. He is compliant with medications- Exforge 5-320 mg/d. Patient has these side effects of medication: none Diet/exercise as above.  No Cp or SOB.   Past Medical History:  Diagnosis Date   Hyperlipidemia    Hypertension    Prostate cancer ([place])    Wears glasses      Related testing: Retinal exam: Done Pneumovax: done  Objective:  BP 118/76   Pulse (!) 58   Temp 98.5 F (36.9 C) (Oral)   Ht 5' 8.5" (1.74 m)   Wt 196 lb 4 oz (89 kg)   SpO2 99%   BMI 29.41 kg/m  General:  Well developed, well nourished, in no apparent distress Skin:  Warm, no pallor or diaphoresis Head:  Normocephalic, atraumatic Eyes:  Pupils equal and round, sclera anicteric without injection  Lungs:  CTAB, no access msc use Cardio:  RRR, no bruits, no LE edema Musculoskeletal:  Symmetrical muscle groups noted without atrophy or deformity Neuro:  Sensation intact to pinprick on feet Psych: Age appropriate judgment and insight  Assessment:   Type 2 diabetes mellitus with hyperglycemia, without long-term current use of insulin (HCC) - Plan: Comprehensive metabolic panel, Lipid panel, Hemoglobin A1c  Essential hypertension   Plan:   Chronic, stable. Cont diet control, Crestor 20 mg/d. Counseled on diet and exercise. Chronic, stable. Cont Exforge 5-320 mg/d.  Flu shot today. Bivalent covid booster rec'd.  F/u in 6 mo. The patient voiced understanding and agreement to the plan.  [place], DO 09/02/21 7:57 AM

## 2021-12-31 IMAGING — DX DG CHEST 2V
2 series · 2 of 2 positions shown · non-contrast
Comparison: None.

CLINICAL DATA: Preoperative assessment.  Hypertension.

EXAM:
CHEST - 2 VIEW

[chest pa]
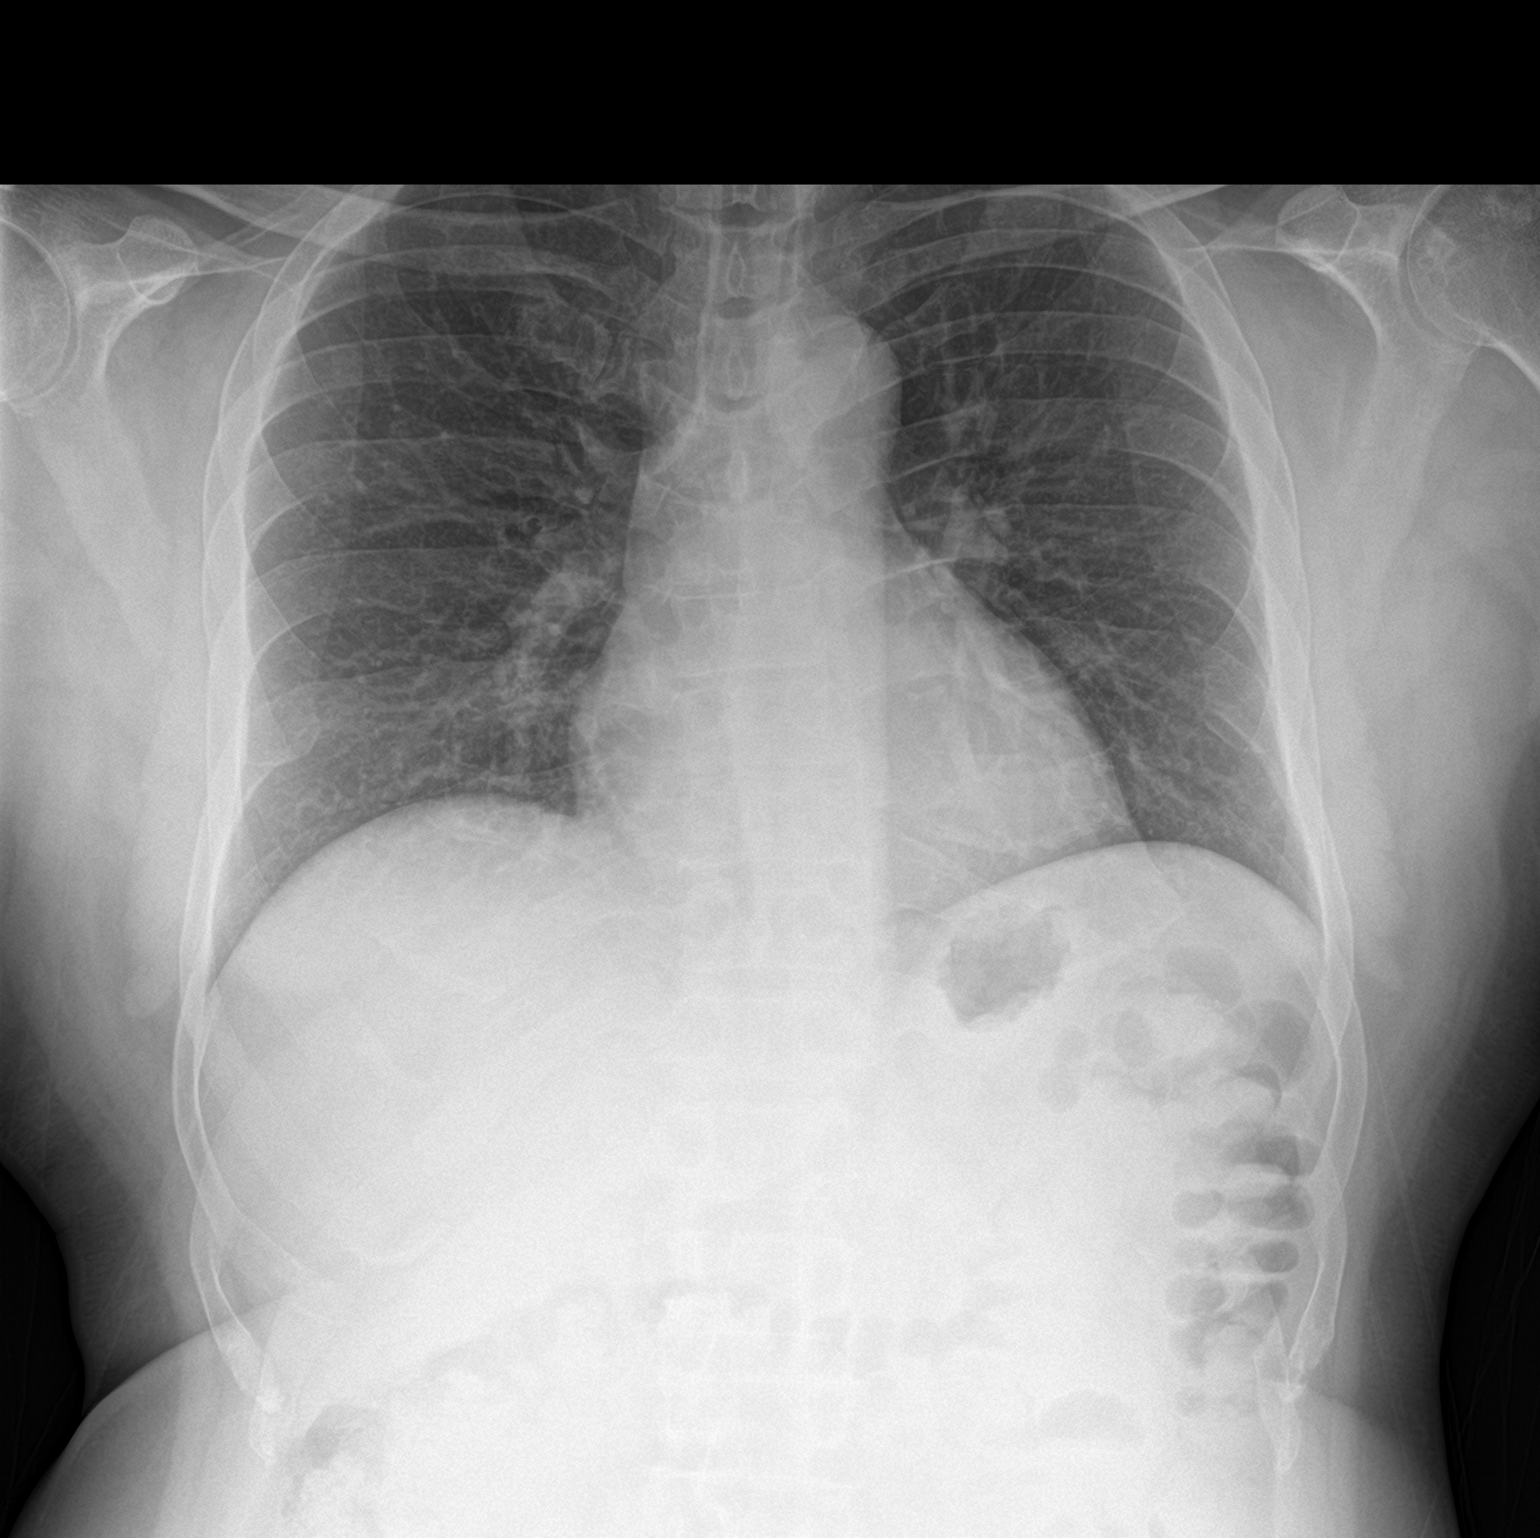

[chest lat]
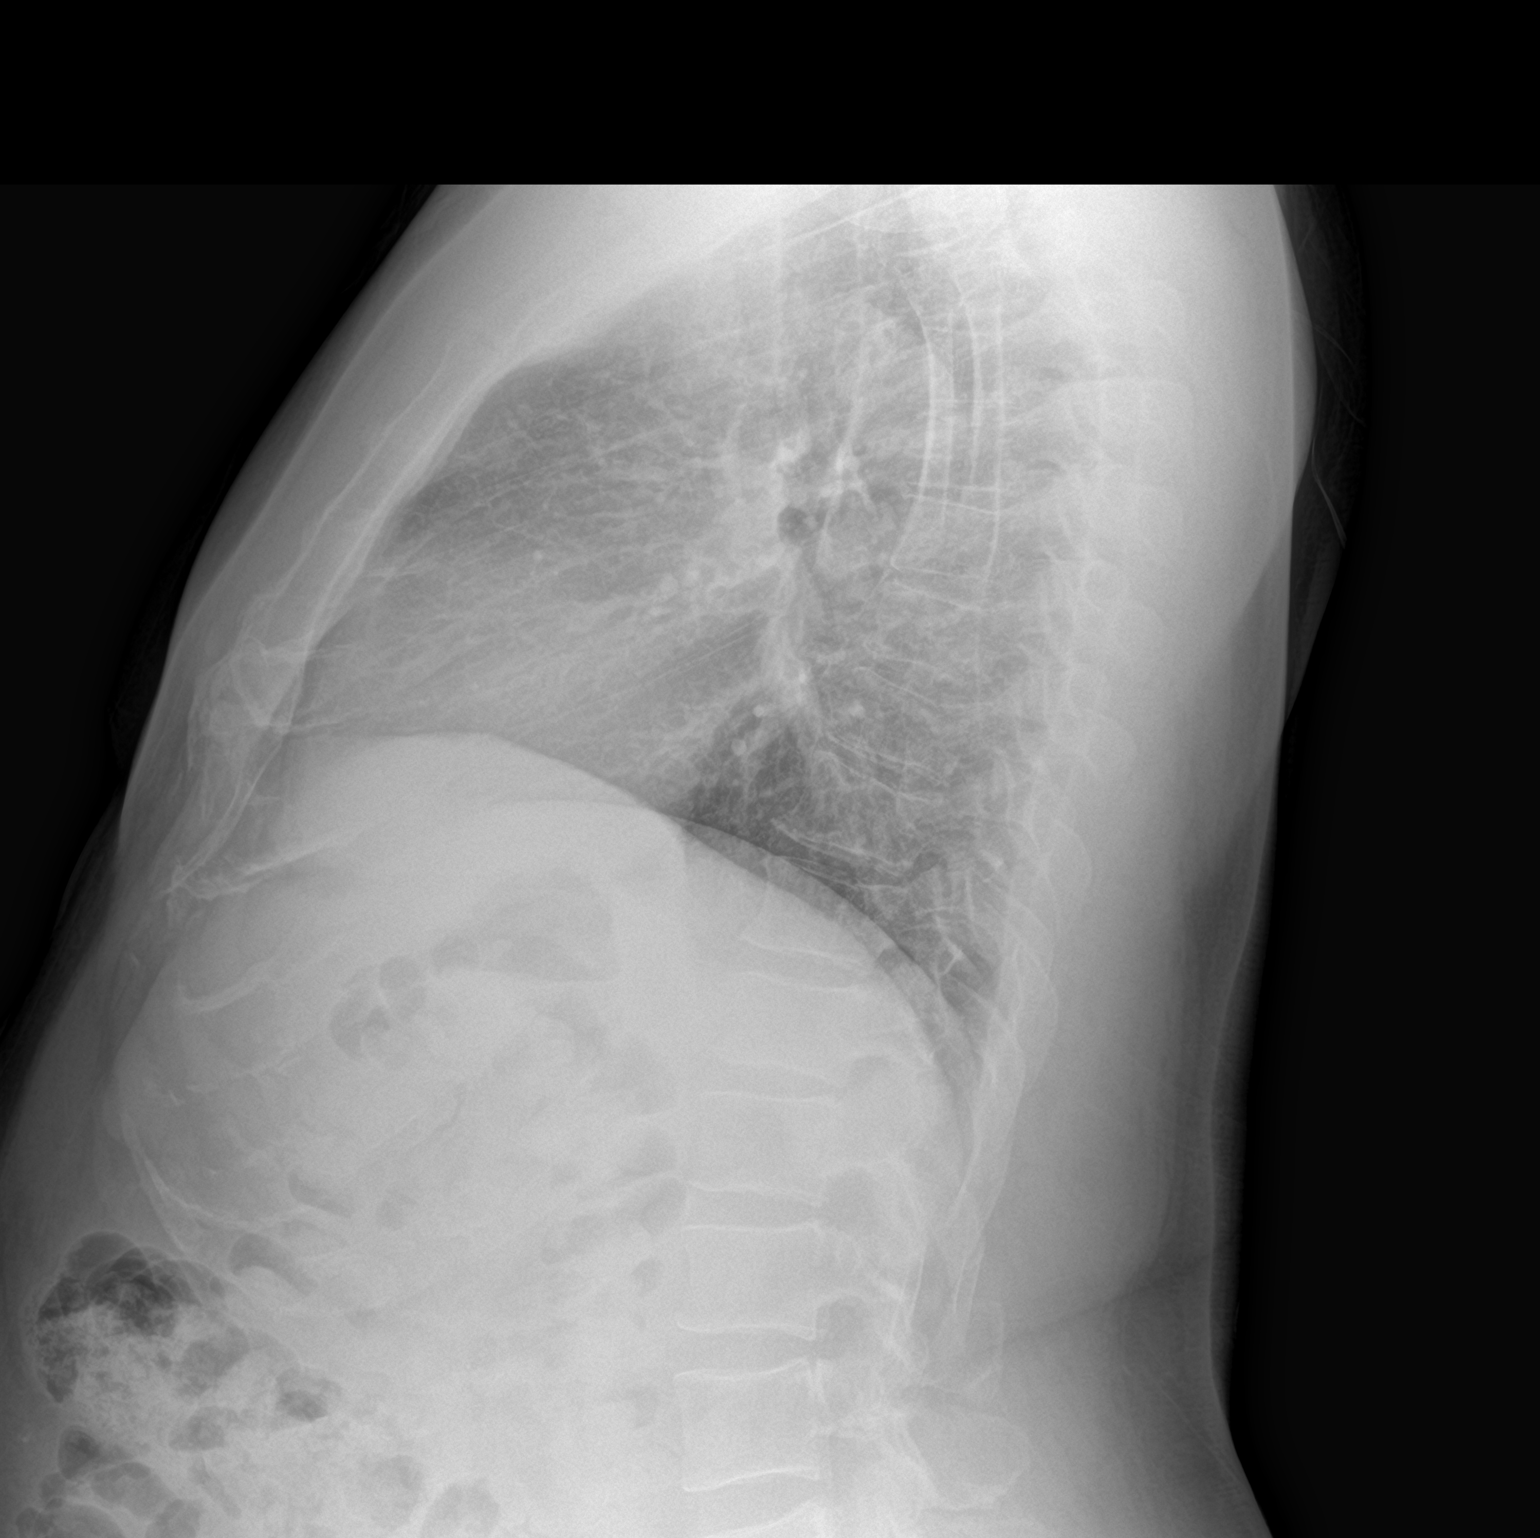

[2 of 2 positions shown; findings below may reference images not displayed]

FINDINGS: The lungs are clear. The heart size and pulmonary vascularity are
normal. No adenopathy. No bone lesions.
IMPRESSION: Lungs clear.  Cardiac silhouette normal.

## 2022-03-09 ENCOUNTER — Ambulatory Visit (INDEPENDENT_AMBULATORY_CARE_PROVIDER_SITE_OTHER): Payer: Managed Care, Other (non HMO) | Admitting: Family Medicine

## 2022-03-09 ENCOUNTER — Encounter: Payer: Self-pay | Admitting: Family Medicine

## 2022-03-09 VITALS — BP 122/82 | HR 49 | Temp 98.5°F | Ht 68.5 in | Wt 195.1 lb

## 2022-03-09 DIAGNOSIS — E1165 Type 2 diabetes mellitus with hyperglycemia: Secondary | ICD-10-CM

## 2022-03-09 DIAGNOSIS — Z0000 Encounter for general adult medical examination without abnormal findings: Secondary | ICD-10-CM | POA: Diagnosis not present

## 2022-03-09 LAB — COMPREHENSIVE METABOLIC PANEL
ALT: 24 U/L (ref 0–53)
AST: 21 U/L (ref 0–37)
Albumin: 4.4 g/dL (ref 3.5–5.2)
Alkaline Phosphatase: 68 U/L (ref 39–117)
BUN: 10 mg/dL (ref 6–23)
CO2: 28 mEq/L (ref 19–32)
Calcium: 9.6 mg/dL (ref 8.4–10.5)
Chloride: 106 mEq/L (ref 96–112)
Creatinine, Ser: 1 mg/dL (ref 0.40–1.50)
GFR: 82.6 mL/min (ref >60.00)
Glucose, Bld: 129 mg/dL — ABNORMAL HIGH (ref 70–99)
Potassium: 4 mEq/L (ref 3.5–5.1)
Sodium: 142 mEq/L (ref 135–145)
Total Bilirubin: 0.6 mg/dL (ref 0.2–1.2)
Total Protein: 7.2 g/dL (ref 6.0–8.3)

## 2022-03-09 LAB — LIPID PANEL
Cholesterol: 113 mg/dL (ref 0–200)
HDL: 43.8 mg/dL (ref >39.00)
LDL Cholesterol: 58 mg/dL (ref 0–99)
NonHDL: 68.85
Total CHOL/HDL Ratio: 3
Triglycerides: 55 mg/dL (ref 0.0–149.0)
VLDL: 11 mg/dL (ref 0.0–40.0)

## 2022-03-09 LAB — CBC
HCT: 40.9 % (ref 39.0–52.0)
Hemoglobin: 13.1 g/dL (ref 13.0–17.0)
MCHC: 31.9 g/dL (ref 30.0–36.0)
MCV: 84.1 fl (ref 78.0–100.0)
Platelets: 241 10*3/uL (ref 150.0–400.0)
RBC: 4.87 Mil/uL (ref 4.22–5.81)
RDW: 15.4 % (ref 11.5–15.5)
WBC: 8.3 10*3/uL (ref 4.0–10.5)

## 2022-03-09 LAB — MICROALBUMIN / CREATININE URINE RATIO
Creatinine,U: 320.8 mg/dL
Microalb Creat Ratio: 0.7 mg/g (ref 0.0–30.0)
Microalb, Ur: 2.2 mg/dL — ABNORMAL HIGH (ref 0.0–1.9)

## 2022-03-09 LAB — HEMOGLOBIN A1C: Hgb A1c MFr Bld: 7 % — ABNORMAL HIGH (ref 4.6–6.5)

## 2022-03-09 NOTE — Progress Notes (Signed)
Chief Complaint  Patient presents with   Annual Exam    Well Male Danny Vincent is here for a complete physical.   His last physical was >1 year ago.  Current diet: in general, a "healthy" diet.  Current exercise: lifting, cardio Weight trend: stable Fatigue out of ordinary? No. Seat belt? Yes.   Advanced directive? No  Health maintenance Shingrix- Yes Colonoscopy- Yes Tetanus- Yes HIV- Yes Hep C- Yes   Past Medical History:  Diagnosis Date   Hyperlipidemia    Hypertension    Prostate cancer ([place])    Wears glasses       Past Surgical History:  Procedure Laterality Date   NO PAST SURGERIES     PROSTATE BIOPSY     RADIOACTIVE SEED IMPLANT N/A 12/03/2020   Procedure: RADIOACTIVE SEED IMPLANT/BRACHYTHERAPY IMPLANT;  Surgeon: Robley Fries, MD;  Location: [place];  Service: Urology;  Laterality: N/A;  90 MINS   SPACE OAR INSTILLATION N/A 12/03/2020   Procedure: SPACE OAR INSTILLATION;  Surgeon: Robley Fries, MD;  Location: [facility];  Service: Urology;  Laterality: N/A;    Medications  Current Outpatient Medications on File Prior to Visit  Medication Sig Dispense Refill   amLODipine-valsartan (EXFORGE) 5-320 MG tablet TAKE 1 TABLET BY MOUTH DAILY 90 tablet 2   Cyanocobalamin (VITAMIN B-12 PO) Take by mouth daily.     ECHINACEA PO Take by mouth daily.     GINSENG PO Take by mouth daily.     Multiple Vitamins-Minerals (ZINC PO) Take by mouth daily.     rosuvastatin (CRESTOR) 20 MG tablet TAKE 1 TABLET(20 MG) BY MOUTH DAILY 90 tablet 3   Turmeric (QC TUMERIC COMPLEX PO) Take by mouth in the morning, at noon, and at bedtime.     VITAMIN D PO Take by mouth daily.      Allergies No Known Allergies  Family History Family History  Problem Relation Age of Onset   Diabetes Mother    Hypertension Mother    Diabetes Father    Prostate cancer Father        dx late 60s-early 60s   Breast cancer Sister 99   Prostate cancer  Paternal Uncle        mets, dx >50, d. 74s   Prostate cancer Paternal Uncle        dx >50, d. 15s   Prostate cancer Paternal Uncle        dx >29, d. 69s   Prostate cancer Paternal Uncle        dx >50, d. 55s   Lung cancer Maternal Uncle        dx >50, smoking hx   Prostate cancer Cousin 57   Esophageal cancer Neg Hx    Rectal cancer Neg Hx    Stomach cancer Neg Hx    Colon cancer Neg Hx     Review of Systems: Constitutional:  no fevers Eye:  no recent significant change in vision Ear/Nose/Mouth/Throat:  Ears:  no hearing loss Nose/Mouth/Throat:  no complaints of nasal congestion, no sore throat Cardiovascular:  no chest pain Respiratory:  no shortness of breath Gastrointestinal:  no change in bowel habits GU:  Male: negative for dysuria, frequency Musculoskeletal/Extremities:  no joint pain Integumentary (Skin/Breast):  no abnormal skin lesions reported Neurologic:  no headaches Endocrine: No unexpected weight changes Hematologic/Lymphatic:  no abnormal bleeding  Exam BP 122/82   Pulse (!) 49   Temp 98.5 F (36.9 C) (Oral)  Ht 5' 8.5" (1.74 m)   Wt 195 lb 2 oz (88.5 kg)   SpO2 99%   BMI 29.24 kg/m  General:  well developed, well nourished, in no apparent distress Skin:  no significant moles, warts, or growths Head:  no masses, lesions, or tenderness Eyes:  pupils equal and round, sclera anicteric without injection Ears:  canals without lesions, TMs shiny without retraction, no obvious effusion, no erythema Nose:  nares patent, septum midline, mucosa normal Throat/Pharynx:  lips and gingiva without lesion; tongue and uvula midline; non-inflamed pharynx; no exudates or postnasal drainage Neck: neck supple without adenopathy, thyromegaly, or masses Cardiac: RRR, no bruits, no LE edema Lungs:  clear to auscultation, breath sounds equal bilaterally, no respiratory distress Abdomen: BS+, soft, non-tender, non-distended, no masses or organomegaly noted Rectal:  Deferred Musculoskeletal:  symmetrical muscle groups noted without atrophy or deformity Neuro:  gait normal; deep tendon reflexes normal and symmetric Psych: well oriented with normal range of affect and appropriate judgment/insight  Assessment and Plan  Well adult exam - Plan: CBC, Comprehensive metabolic panel, Lipid panel  Type 2 diabetes mellitus with hyperglycemia, without long-term current use of insulin (HCC) - Plan: Hemoglobin A1c, Microalbumin / creatinine urine ratio   Well 61 y.o. male. Counseled on diet and exercise. Counseled on risks and benefits of prostate cancer screening with PSA. The patient follows with urology.  Advanced directive form provided today.  Immunizations, labs, and further orders as above. Follow up in 6 mo or prn. The patient voiced understanding and agreement to the plan.  [place], DO 03/09/22 9:26 AM

## 2022-03-09 NOTE — Patient Instructions (Signed)
Give us 2-3 business days to get the results of your labs back.   Keep the diet clean and stay active.  Please get me a copy of your advanced directive form at your convenience.   Let us know if you need anything.  

## 2022-08-24 ENCOUNTER — Other Ambulatory Visit: Payer: Self-pay | Admitting: Family Medicine

## 2022-08-24 MED ORDER — ROSUVASTATIN CALCIUM 20 MG PO TABS: ORAL_TABLET | ORAL | 3 refills | Status: DC

## 2022-09-07 ENCOUNTER — Ambulatory Visit: Payer: Managed Care, Other (non HMO) | Admitting: Family Medicine

## 2022-09-07 ENCOUNTER — Encounter: Payer: Self-pay | Admitting: Family Medicine

## 2022-09-07 VITALS — BP 120/86 | HR 90 | Temp 98.6°F | Ht 68.5 in | Wt 198.4 lb

## 2022-09-07 DIAGNOSIS — Z23 Encounter for immunization: Secondary | ICD-10-CM | POA: Diagnosis not present

## 2022-09-07 DIAGNOSIS — I10 Essential (primary) hypertension: Secondary | ICD-10-CM

## 2022-09-07 DIAGNOSIS — E1165 Type 2 diabetes mellitus with hyperglycemia: Secondary | ICD-10-CM

## 2022-09-07 LAB — HEMOGLOBIN A1C: Hgb A1c MFr Bld: 7.2 % — ABNORMAL HIGH (ref 4.6–6.5)

## 2022-09-07 LAB — LIPID PANEL
Cholesterol: 115 mg/dL (ref 0–200)
HDL: 45.2 mg/dL (ref >39.00)
LDL Cholesterol: 59 mg/dL (ref 0–99)
NonHDL: 70.25
Total CHOL/HDL Ratio: 3
Triglycerides: 57 mg/dL (ref 0.0–149.0)
VLDL: 11.4 mg/dL (ref 0.0–40.0)

## 2022-09-07 MED ORDER — AMLODIPINE BESYLATE-VALSARTAN 5-320 MG PO TABS: 1.0000 | ORAL_TABLET | Freq: Every day | ORAL | 3 refills | Status: DC

## 2022-09-07 NOTE — Addendum Note (Signed)
Addended by: Sharon Seller B on: 09/07/2022 09:23 AM   Modules accepted: Orders

## 2022-09-07 NOTE — Patient Instructions (Addendum)
Give [place] 2-3 business days to get the results of your labs back.   Keep the diet clean and stay active.  Please schedule your eye exam.   Let us know if you need anything.

## 2022-09-07 NOTE — Progress Notes (Signed)
Subjective:   Chief Complaint  Patient presents with   Follow-up    6 month Needs Refill on BP medication.      Danny Vincent is a 61 y.o. male here for follow-up of diabetes.   Zachry does not routinely monitor his sugars.  Patient does not require insulin.   Medications include: diet control; compliant w Crestor 20 mg/d.  Diet is fair.  Exercise: lifting wts, elliptical machine  Hypertension Patient presents for hypertension follow up. He does monitor home blood pressures. Blood pressures ranging on average from 130's/80's. He is compliant with medications- Exforge 5-320 mg/d. Patient has these side effects of medication: none Diet/exercise as above. No CP or SOB.   Past Medical History:  Diagnosis Date   Hyperlipidemia    Hypertension    Prostate cancer ([place])    Wears glasses      Related testing: Retinal exam: Due Pneumovax: done  Objective:  BP 120/86 (BP Location: Right Arm, Patient Position: Sitting, Cuff Size: Normal)   Pulse 90   Temp 98.6 F (37 C) (Oral)   Ht 5' 8.5" (1.74 m)   Wt 198 lb 6 oz (90 kg)   SpO2 96%   BMI 29.72 kg/m  General:  Well developed, well nourished, in no apparent distress Skin:  Warm, no pallor or diaphoresis Lungs:  CTAB, no access msc use Cardio:  RRR, no bruits, no LE edema Musculoskeletal:  Symmetrical muscle groups noted without atrophy or deformity Neuro:  Sensation intact to pinprick on feet Psych: Age appropriate judgment and insight  Assessment:   Type 2 diabetes mellitus with hyperglycemia, without long-term current use of insulin (HCC) - Plan: Ceruloplasmin, Hemoglobin A1c, Lipid panel  Essential hypertension   Plan:   Chronic, stable. Cont diet control, Crestor 20 mg/d. Counseled on diet and exercise. Chronic, stable. Cont Exforge 5-320 mg/d.  Flu shot today.  F/u in 6 mo. The patient voiced understanding and agreement to the plan.  [place], DO 09/07/22 9:09 AM

## 2022-09-09 LAB — CERULOPLASMIN: Ceruloplasmin: 27 mg/dL (ref 18–36)

## 2022-09-16 ENCOUNTER — Encounter: Payer: Self-pay | Admitting: Family Medicine

## 2023-03-12 ENCOUNTER — Encounter: Payer: Managed Care, Other (non HMO) | Admitting: Family Medicine

## 2023-03-15 ENCOUNTER — Encounter: Payer: Self-pay | Admitting: Family Medicine

## 2023-03-15 ENCOUNTER — Ambulatory Visit (INDEPENDENT_AMBULATORY_CARE_PROVIDER_SITE_OTHER): Payer: Managed Care, Other (non HMO) | Admitting: Family Medicine

## 2023-03-15 VITALS — BP 121/72 | HR 53 | Temp 99.0°F | Ht 68.5 in | Wt 193.0 lb

## 2023-03-15 DIAGNOSIS — E1165 Type 2 diabetes mellitus with hyperglycemia: Secondary | ICD-10-CM

## 2023-03-15 DIAGNOSIS — Z0000 Encounter for general adult medical examination without abnormal findings: Secondary | ICD-10-CM

## 2023-03-15 DIAGNOSIS — Z7984 Long term (current) use of oral hypoglycemic drugs: Secondary | ICD-10-CM | POA: Diagnosis not present

## 2023-03-15 LAB — CBC
HCT: 41.7 % (ref 39.0–52.0)
Hemoglobin: 13.2 g/dL (ref 13.0–17.0)
MCHC: 31.7 g/dL (ref 30.0–36.0)
MCV: 85.7 fl (ref 78.0–100.0)
Platelets: 283 10*3/uL (ref 150.0–400.0)
RBC: 4.86 Mil/uL (ref 4.22–5.81)
RDW: 15.1 % (ref 11.5–15.5)
WBC: 7.8 10*3/uL (ref 4.0–10.5)

## 2023-03-15 LAB — MICROALBUMIN / CREATININE URINE RATIO
Creatinine,U: 157.7 mg/dL
Microalb Creat Ratio: 0.4 mg/g (ref 0.0–30.0)
Microalb, Ur: <0.7 mg/dL (ref 0.0–1.9)

## 2023-03-15 LAB — LIPID PANEL
Cholesterol: 181 mg/dL (ref 0–200)
HDL: 44.7 mg/dL (ref >39.00)
LDL Cholesterol: 120 mg/dL — ABNORMAL HIGH (ref 0–99)
NonHDL: 136.09
Total CHOL/HDL Ratio: 4
Triglycerides: 78 mg/dL (ref 0.0–149.0)
VLDL: 15.6 mg/dL (ref 0.0–40.0)

## 2023-03-15 LAB — COMPREHENSIVE METABOLIC PANEL
ALT: 15 U/L (ref 0–53)
AST: 15 U/L (ref 0–37)
Albumin: 4.2 g/dL (ref 3.5–5.2)
Alkaline Phosphatase: 64 U/L (ref 39–117)
BUN: 12 mg/dL (ref 6–23)
CO2: 27 mEq/L (ref 19–32)
Calcium: 9.4 mg/dL (ref 8.4–10.5)
Chloride: 105 mEq/L (ref 96–112)
Creatinine, Ser: 0.92 mg/dL (ref 0.40–1.50)
GFR: 90.64 mL/min (ref >60.00)
Glucose, Bld: 130 mg/dL — ABNORMAL HIGH (ref 70–99)
Potassium: 3.6 mEq/L (ref 3.5–5.1)
Sodium: 140 mEq/L (ref 135–145)
Total Bilirubin: 0.5 mg/dL (ref 0.2–1.2)
Total Protein: 7 g/dL (ref 6.0–8.3)

## 2023-03-15 LAB — HEMOGLOBIN A1C: Hgb A1c MFr Bld: 6.4 % (ref 4.6–6.5)

## 2023-03-15 NOTE — Patient Instructions (Signed)
Give us 2-3 business days to get the results of your labs back.  ? ?Keep the diet clean and stay active. ? ?Please get me a copy of your advanced directive form at your convenience.  ? ?OK to use Debrox (peroxide) in the ear to loosen up wax. Also recommend using a bulb syringe (for removing boogers from baby's noses) to flush through warm water and vinegar (3-4:1 ratio). An alternative, though more expensive, is an elephant ear washer wax removal kit. Do not use Q-tips as this can impact wax further. ? ?Let us know if you need anything. ?

## 2023-03-15 NOTE — Progress Notes (Signed)
Chief Complaint  Patient presents with   Annual Exam    Well Male Danny Vincent is here for a complete physical.   His last physical was >1 year ago.  Current diet: in general, a "healthy" diet.  Current exercise: Elliptical, lifting wts Weight trend: stable Fatigue out of ordinary? No. Seat belt? Yes.   Advanced directive? No  Health maintenance Shingrix- Yes Colonoscopy- Yes Tetanus- Yes HIV- Yes Hep C- Yes   Past Medical History:  Diagnosis Date   Hyperlipidemia    Hypertension    Prostate cancer (HCC)    Wears glasses       Past Surgical History:  Procedure Laterality Date   NO PAST SURGERIES     PROSTATE BIOPSY     RADIOACTIVE SEED IMPLANT N/A 12/03/2020   Procedure: RADIOACTIVE SEED IMPLANT/BRACHYTHERAPY IMPLANT;  Surgeon: Noel Christmas, MD;  Location: [facility] [place];  Service: Urology;  Laterality: N/A;  90 MINS   SPACE OAR INSTILLATION N/A 12/03/2020   Procedure: SPACE OAR INSTILLATION;  Surgeon: Noel Christmas, MD;  Location: [facility];  Service: Urology;  Laterality: N/A;    Medications  Current Outpatient Medications on File Prior to Visit  Medication Sig Dispense Refill   amLODipine-valsartan (EXFORGE) 5-320 MG tablet Take 1 tablet by mouth daily. 90 tablet 3   Cyanocobalamin (VITAMIN B-12 PO) Take by mouth daily.     ECHINACEA PO Take by mouth daily.     GINSENG PO Take by mouth daily.     Multiple Vitamins-Minerals (ZINC PO) Take by mouth daily.     rosuvastatin (CRESTOR) 20 MG tablet TAKE 1 TABLET(20 MG) BY MOUTH DAILY 90 tablet 3   Turmeric (QC TUMERIC COMPLEX PO) Take by mouth in the morning, at noon, and at bedtime.     VITAMIN D PO Take by mouth daily.     Allergies No Known Allergies  Family History Family History  Problem Relation Age of Onset   Diabetes Mother    Hypertension Mother    Diabetes Father    Prostate cancer Father        dx late 16s-early 19s   Breast cancer Sister 66   Prostate  cancer Paternal Uncle        mets, dx >50, d. 76s   Prostate cancer Paternal Uncle        dx >85, d. 26s   Prostate cancer Paternal Uncle        dx >55, d. 2s   Prostate cancer Paternal Uncle        dx >50, d. 74s   Lung cancer Maternal Uncle        dx >50, smoking hx   Prostate cancer Cousin 67   Esophageal cancer Neg Hx    Rectal cancer Neg Hx    Stomach cancer Neg Hx    Colon cancer Neg Hx     Review of Systems: Constitutional:  no fevers Eye:  no recent significant change in vision Ear/Nose/Mouth/Throat:  Ears:  no hearing loss Nose/Mouth/Throat:  no complaints of nasal congestion, no sore throat Cardiovascular:  no chest pain Respiratory:  no shortness of breath Gastrointestinal:  no change in bowel habits GU:  Male: negative for dysuria, frequency Musculoskeletal/Extremities:  no joint pain Integumentary (Skin/Breast):  no abnormal skin lesions reported Neurologic:  no headaches Endocrine: No unexpected weight changes Hematologic/Lymphatic:  no abnormal bleeding  Exam BP 121/72 (BP Location: Left Arm, Patient Position: Sitting, Cuff Size: Normal)   Pulse Marland Kitchen)  53   Temp 99 F (37.2 C) (Oral)   Ht 5' 8.5" (1.74 m)   Wt 193 lb (87.5 kg)   SpO2 98%   BMI 28.92 kg/m  General:  well developed, well nourished, in no apparent distress Skin:  no significant moles, warts, or growths Head:  no masses, lesions, or tenderness Eyes:  pupils equal and round, sclera anicteric without injection Ears:  canals 100% obstructed w cerumen Nose:  nares patent, mucosa normal Throat/Pharynx:  lips and gingiva without lesion; tongue and uvula midline; non-inflamed pharynx; no exudates or postnasal drainage Neck: neck supple without adenopathy, thyromegaly, or masses Cardiac: Bradycardic, regular rhythm, no bruits, no LE edema Lungs:  clear to auscultation, breath sounds equal bilaterally, no respiratory distress Abdomen: BS+, soft, non-tender, non-distended, no masses or organomegaly  noted Rectal: Deferred Musculoskeletal:  symmetrical muscle groups noted without atrophy or deformity Neuro:  gait normal; deep tendon reflexes normal and symmetric Psych: well oriented with normal range of affect and appropriate judgment/insight  Assessment and Plan  Well adult exam - Plan: CBC, Comprehensive metabolic panel, Lipid panel  Type 2 diabetes mellitus with hyperglycemia, without long-term current use of insulin (HCC) - Plan: Hemoglobin A1c, Microalbumin / creatinine urine ratio   Well 62 y.o. male. Counseled on diet and exercise. Pt f/w Urology for prostate health.  Immunizations, labs, and further orders as above. Instructions for cleaning out ears recommended. Advanced directive form provided today.  Follow up in 6 mo. The patient voiced understanding and agreement to the plan.  Jilda Roche [place], DO 03/15/23 10:59 AM

## 2023-07-19 ENCOUNTER — Ambulatory Visit: Payer: Managed Care, Other (non HMO) | Admitting: Family Medicine

## 2023-07-19 VITALS — BP 136/82 | HR 66 | Temp 98.0°F | Resp 16 | Ht 68.0 in | Wt 195.0 lb

## 2023-07-19 DIAGNOSIS — H8111 Benign paroxysmal vertigo, right ear: Secondary | ICD-10-CM | POA: Diagnosis not present

## 2023-07-19 NOTE — Progress Notes (Signed)
Chief Complaint  Patient presents with   Dizziness    Here for Dizziness     Danny Vincent is 62 y.o. pt here for dizziness.  Duration: 1 day Lasts for several seconds if he stays still.  He is not affected by position changes from sitting to standing.  Denies lightheadedness. Pass out? No Spinning? Yes Recent illness/fever? No Headache? No Neurologic signs? No Change in PO intake? No Palpitations? No No illicit drug use or med changes.   Past Medical History:  Diagnosis Date   Hyperlipidemia    Hypertension    Prostate cancer (HCC)    Wears glasses     Family History  Problem Relation Age of Onset   Diabetes Mother    Hypertension Mother    Diabetes Father    Prostate cancer Father        dx late 67s-early 10s   Breast cancer Sister 36   Prostate cancer Paternal Uncle        mets, dx >50, d. 75s   Prostate cancer Paternal Uncle        dx >64, d. 24s   Prostate cancer Paternal Uncle        dx >47, d. 3s   Prostate cancer Paternal Uncle        dx >50, d. 15s   Lung cancer Maternal Uncle        dx >50, smoking hx   Prostate cancer Cousin 63   Esophageal cancer Neg Hx    Rectal cancer Neg Hx    Stomach cancer Neg Hx    Colon cancer Neg Hx     Allergies as of 07/19/2023   No Known Allergies      Medication List        Accurate as of July 19, 2023  4:39 PM. If you have any questions, ask your nurse or doctor.          amLODipine-valsartan 5-320 MG tablet Commonly known as: EXFORGE Take 1 tablet by mouth daily.   ECHINACEA PO Take by mouth daily.   GINSENG PO Take by mouth daily.   QC TUMERIC COMPLEX PO Take by mouth in the morning, at noon, and at bedtime.   rosuvastatin 20 MG tablet Commonly known as: CRESTOR TAKE 1 TABLET(20 MG) BY MOUTH DAILY   VITAMIN B-12 PO Take by mouth daily.   VITAMIN D PO Take by mouth daily.   ZINC PO Take by mouth daily.        BP 136/82 (BP Location: Left Arm, Patient Position: Sitting,  Cuff Size: Normal)   Pulse 66   Temp 98 F (36.7 C) (Oral)   Resp 16   Ht 5\' 8"  (1.727 m)   Wt 195 lb (88.5 kg)   SpO2 98%   BMI 29.65 kg/m  General: Awake, alert, appears stated age Eyes: PERRLA, EOMi Heart: RRR, no murmurs, no carotid bruits Lungs: CTAB, no accessory muscle use MSK: 5/5 strength throughout, gait normal Neuro: No cerebellar signs, patellar reflex 3/4 b/l wo clonus, calcaneal reflex 0/4 b/l wo clonus, biceps reflex 1/4 b/l wo clonus; Dix-Hall-Pike + on right. Psych: Age appropriate judgment and insight, normal mood and affect  Benign paroxysmal positional vertigo of right ear  Epley maneuver information provided.  Stay hydrated.  If no improvement the next couple days, will refer to vestibular rehab. F/u prn. Pt voiced understanding and agreement to the plan.  Jilda Roche [place], DO 07/19/23 4:39 PM

## 2023-07-19 NOTE — Patient Instructions (Signed)
Stay hydrated.   YouTube "Epley Maneuver" if the instructions don't make sense.  If no improvement in the next couple days, send me a message.  Let us know if you need anything.

## 2023-07-21 ENCOUNTER — Telehealth: Payer: Self-pay | Admitting: Family Medicine

## 2023-07-21 NOTE — Telephone Encounter (Signed)
Letter done and pt was advised letters ready for pick up. Pt advised letters will be up front.

## 2023-07-21 NOTE — Telephone Encounter (Signed)
Pt called back to request a work note excusing from work [employer]. He will send his wife to pick up letter when ready. Please call when ready.

## 2023-09-14 ENCOUNTER — Encounter: Payer: Self-pay | Admitting: Family Medicine

## 2023-09-14 ENCOUNTER — Other Ambulatory Visit: Payer: Self-pay | Admitting: Family Medicine

## 2023-09-14 ENCOUNTER — Ambulatory Visit: Payer: Managed Care, Other (non HMO) | Admitting: Family Medicine

## 2023-09-14 VITALS — BP 122/74 | HR 51 | Temp 98.0°F | Resp 16 | Ht 68.0 in | Wt 202.4 lb

## 2023-09-14 DIAGNOSIS — Z23 Encounter for immunization: Secondary | ICD-10-CM

## 2023-09-14 DIAGNOSIS — I10 Essential (primary) hypertension: Secondary | ICD-10-CM | POA: Diagnosis not present

## 2023-09-14 DIAGNOSIS — E1165 Type 2 diabetes mellitus with hyperglycemia: Secondary | ICD-10-CM

## 2023-09-14 LAB — COMPREHENSIVE METABOLIC PANEL
ALT: 24 U/L (ref 0–53)
AST: 21 U/L (ref 0–37)
Albumin: 4.5 g/dL (ref 3.5–5.2)
Alkaline Phosphatase: 69 U/L (ref 39–117)
BUN: 16 mg/dL (ref 6–23)
CO2: 26 meq/L (ref 19–32)
Calcium: 9.4 mg/dL (ref 8.4–10.5)
Chloride: 105 meq/L (ref 96–112)
Creatinine, Ser: 1.02 mg/dL (ref 0.40–1.50)
GFR: 79.81 mL/min (ref >60.00)
Glucose, Bld: 144 mg/dL — ABNORMAL HIGH (ref 70–99)
Potassium: 3.7 meq/L (ref 3.5–5.1)
Sodium: 142 meq/L (ref 135–145)
Total Bilirubin: 0.7 mg/dL (ref 0.2–1.2)
Total Protein: 7.2 g/dL (ref 6.0–8.3)

## 2023-09-14 LAB — LIPID PANEL
Cholesterol: 150 mg/dL (ref 0–200)
HDL: 49.2 mg/dL (ref >39.00)
LDL Cholesterol: 88 mg/dL (ref 0–99)
NonHDL: 100.98
Total CHOL/HDL Ratio: 3
Triglycerides: 66 mg/dL (ref 0.0–149.0)
VLDL: 13.2 mg/dL (ref 0.0–40.0)

## 2023-09-14 LAB — HEMOGLOBIN A1C: Hgb A1c MFr Bld: 7.3 % — ABNORMAL HIGH (ref 4.6–6.5)

## 2023-09-14 MED ORDER — ROSUVASTATIN CALCIUM 20 MG PO TABS: ORAL_TABLET | ORAL | 3 refills | Status: DC

## 2023-09-14 NOTE — Patient Instructions (Signed)
Give us 2-3 business days to get the results of your labs back.   Keep the diet clean and stay active.  Let us know if you need anything. 

## 2023-09-14 NOTE — Progress Notes (Signed)
Subjective:   Chief Complaint  Patient presents with   Follow-up    Follow up    Danny Vincent is a 62 y.o. male here for follow-up of diabetes.   Danny Vincent does not routinely check his sugars.  Patient does not require insulin.   Medications include: diet controlled. He is on Crestor 20 mg/d.  Diet is OK.  Exercise: Elliptical and wt lifting  Hypertension Patient presents for hypertension follow up. He does monitor home blood pressures. Blood pressures ranging on average from 120's/70's. He is compliant with medications- Exforge 5-320 mg/d. Patient has these side effects of medication: none Diet/exercise as above. No Cp or SOB.   Past Medical History:  Diagnosis Date   Hyperlipidemia    Hypertension    Prostate cancer (HCC)    Wears glasses      Related testing: Retinal exam: Done Pneumovax: done  Objective:  BP 122/74   Pulse (!) 51   Temp 98 F (36.7 C) (Oral)   Resp 16   Ht 5\' 8"  (1.727 m)   Wt 202 lb 6.4 oz (91.8 kg)   SpO2 98%   BMI 30.77 kg/m  General:  Well developed, well nourished, in no apparent distress Skin:  Warm, no pallor or diaphoresis Head:  Normocephalic, atraumatic Eyes:  Pupils equal and round, sclera anicteric without injection  Lungs:  CTAB, no access msc use Cardio:  Reg rhythm, bradycardic, no bruits, no LE edema Musculoskeletal:  Symmetrical muscle groups noted without atrophy or deformity Neuro:  Sensation intact to pinprick on feet Psych: Age appropriate judgment and insight  Assessment:   Type 2 diabetes mellitus with hyperglycemia, without long-term current use of insulin (HCC)  Essential hypertension  Need for influenza vaccination - Plan: Flu vaccine trivalent PF, 6mos and older(Flulaval,Afluria,Fluarix,Fluzone)   Plan:   Chronic, stable. Cont Crestor 20 mg/d. Counseled on diet and exercise. Chronic, stable. Cont Exforge 5-320 mg/d.  Flu shot today.  F/u in 6 mo. The patient voiced understanding and agreement to  the plan.  Jilda Roche [place], DO 09/14/23 9:09 AM

## 2023-12-06 ENCOUNTER — Other Ambulatory Visit: Payer: Self-pay | Admitting: Family Medicine

## 2023-12-06 ENCOUNTER — Ambulatory Visit (INDEPENDENT_AMBULATORY_CARE_PROVIDER_SITE_OTHER): Payer: Managed Care, Other (non HMO) | Admitting: Family Medicine

## 2023-12-06 ENCOUNTER — Encounter: Payer: Self-pay | Admitting: Family Medicine

## 2023-12-06 VITALS — BP 138/82 | HR 59 | Temp 98.2°F | Ht 68.0 in | Wt 207.6 lb

## 2023-12-06 DIAGNOSIS — Z7984 Long term (current) use of oral hypoglycemic drugs: Secondary | ICD-10-CM | POA: Diagnosis not present

## 2023-12-06 DIAGNOSIS — E1165 Type 2 diabetes mellitus with hyperglycemia: Secondary | ICD-10-CM

## 2023-12-06 LAB — HEMOGLOBIN A1C: Hgb A1c MFr Bld: 7.4 % — ABNORMAL HIGH (ref 4.6–6.5)

## 2023-12-06 MED ORDER — METFORMIN HCL ER 500 MG PO TB24
ORAL_TABLET | ORAL | 2 refills | Status: DC
Start: 2023-12-06 — End: 2024-02-15

## 2023-12-06 NOTE — Progress Notes (Signed)
 Subjective:   Chief Complaint  Patient presents with   Follow-up    Patient presents today for a 3 month follow-up    Danny Vincent is a 63 y.o. male here for follow-up of diabetes.   Danny Vincent does not routinely monitor his sugars.  Patient does not require insulin.   Medications include: Diet controlled Diet is the greatest.  Exercise: None Gained around 5 pounds since last visit 3 months ago. No chest pain or shortness of breath.  Past Medical History:  Diagnosis Date   Hyperlipidemia    Hypertension    Prostate cancer (HCC)    Wears glasses      Related testing: Retinal exam: Done Pneumovax: done  Objective:  BP 138/82   Pulse (!) 59   Temp 98.2 F (36.8 C)   Ht 5\' 8"  (1.727 m)   Wt 207 lb 9.6 oz (94.2 kg)   SpO2 97%   BMI 31.57 kg/m  General:  Well developed, well nourished, in no apparent distress Lungs:  CTAB, no access msc use Cardio: Regular rhythm, bradycardic, no bruits, no LE edema Psych: Age appropriate judgment and insight  Assessment:   Type 2 diabetes mellitus with hyperglycemia, without long-term current use of insulin (HCC) - Plan: Hemoglobin A1c   Plan:   Chronic, probably not stable.  If elevated, will start metformin XR 500 mg daily for a week and then he will increase to 1000 mg daily.  Monitor sugars at home.  Counseled on diet and exercise. F/u in 3 mo. The patient voiced understanding and agreement to the plan.  Jilda Roche [place], DO 12/06/23 9:21 AM

## 2023-12-06 NOTE — Patient Instructions (Signed)
Give us 2-3 business days to get the results of your labs back.   Keep the diet clean and stay active.  Aim to do some physical exertion for 150 minutes per week. This is typically divided into 5 days per week, 30 minutes per day. The activity should be enough to get your heart rate up. Anything is better than nothing if you have time constraints.  Let us know if you need anything.  

## 2024-02-12 ENCOUNTER — Other Ambulatory Visit: Payer: Self-pay | Admitting: Family Medicine

## 2024-03-07 ENCOUNTER — Ambulatory Visit: Admitting: Family Medicine

## 2024-03-20 ENCOUNTER — Encounter: Payer: Managed Care, Other (non HMO) | Admitting: Family Medicine

## 2024-03-27 ENCOUNTER — Encounter: Payer: Self-pay | Admitting: Family Medicine

## 2024-03-27 ENCOUNTER — Ambulatory Visit: Payer: Self-pay | Admitting: Family Medicine

## 2024-03-27 ENCOUNTER — Ambulatory Visit: Admitting: Family Medicine

## 2024-03-27 VITALS — BP 130/78 | HR 74 | Temp 98.0°F | Resp 16 | Ht 68.0 in | Wt 201.0 lb

## 2024-03-27 DIAGNOSIS — E1165 Type 2 diabetes mellitus with hyperglycemia: Secondary | ICD-10-CM | POA: Diagnosis not present

## 2024-03-27 DIAGNOSIS — E7800 Pure hypercholesterolemia, unspecified: Secondary | ICD-10-CM | POA: Insufficient documentation

## 2024-03-27 DIAGNOSIS — I10 Essential (primary) hypertension: Secondary | ICD-10-CM

## 2024-03-27 LAB — COMPREHENSIVE METABOLIC PANEL WITH GFR
ALT: 20 U/L (ref 0–53)
AST: 17 U/L (ref 0–37)
Albumin: 4.5 g/dL (ref 3.5–5.2)
Alkaline Phosphatase: 70 U/L (ref 39–117)
BUN: 11 mg/dL (ref 6–23)
CO2: 29 meq/L (ref 19–32)
Calcium: 9.6 mg/dL (ref 8.4–10.5)
Chloride: 106 meq/L (ref 96–112)
Creatinine, Ser: 1.08 mg/dL (ref 0.40–1.50)
GFR: 74.24 mL/min (ref >60.00)
Glucose, Bld: 146 mg/dL — ABNORMAL HIGH (ref 70–99)
Potassium: 4.2 meq/L (ref 3.5–5.1)
Sodium: 141 meq/L (ref 135–145)
Total Bilirubin: 0.5 mg/dL (ref 0.2–1.2)
Total Protein: 7.3 g/dL (ref 6.0–8.3)

## 2024-03-27 LAB — LIPID PANEL
Cholesterol: 182 mg/dL (ref 0–200)
HDL: 47.2 mg/dL (ref >39.00)
LDL Cholesterol: 116 mg/dL — ABNORMAL HIGH (ref 0–99)
NonHDL: 134.44
Total CHOL/HDL Ratio: 4
Triglycerides: 91 mg/dL (ref 0.0–149.0)
VLDL: 18.2 mg/dL (ref 0.0–40.0)

## 2024-03-27 LAB — MICROALBUMIN / CREATININE URINE RATIO
Creatinine,U: 318.6 mg/dL
Microalb Creat Ratio: 9.7 mg/g (ref 0.0–30.0)
Microalb, Ur: 3.1 mg/dL — ABNORMAL HIGH (ref 0.0–1.9)

## 2024-03-27 LAB — HEMOGLOBIN A1C: Hgb A1c MFr Bld: 7.4 % — ABNORMAL HIGH (ref 4.6–6.5)

## 2024-03-27 MED ORDER — DAPAGLIFLOZIN PROPANEDIOL 10 MG PO TABS: 10.0000 mg | ORAL_TABLET | Freq: Every day | ORAL | 3 refills | Status: DC

## 2024-03-27 NOTE — Progress Notes (Signed)
 Subjective:   Chief Complaint  Patient presents with   Follow-up    Follow Up    Danny Vincent is a 63 y.o. male here for follow-up of diabetes.   Danny Vincent does not routinely monitor his sugars.  Patient does not require insulin.   Medications include: metformin  XR 1000 mg/d- gave him diarrhea Diet could be better.  Exercise: walking  Hypertension Patient presents for hypertension follow up. He does not monitor home blood pressures. He is compliant with medications- Exforge  5-320 mg/d. Patient has these side effects of medication: none Diet/exercise as above.   Hyperlipidemia Patient presents for hyperlipidemia follow up. Currently being treated with Crestor  20 mg/d and compliance with treatment thus far has been good. He denies myalgias. Diet/exercise as above.  The patient is not known to have coexisting coronary artery disease.   Past Medical History:  Diagnosis Date   Hyperlipidemia    Hypertension    Prostate cancer (HCC)    Wears glasses      Related testing: Retinal exam: Done Pneumovax: Done  Objective:  BP 130/78 (BP Location: Left Arm, Patient Position: Sitting)   Pulse 74   Temp 98 F (36.7 C) (Oral)   Resp 16   Ht 5' 8 (1.727 m)   Wt 201 lb (91.2 kg)   SpO2 97%   BMI 30.56 kg/m  General:  Well developed, well nourished, in no apparent distress Lungs:  CTAB, no access msc use Cardio:  RRR, no bruits, no LE edema Psych: Age appropriate judgment and insight  Assessment:   Type 2 diabetes mellitus with hyperglycemia, without long-term current use of insulin (HCC) - Plan: Comprehensive metabolic panel with GFR, Lipid panel, Microalbumin / creatinine urine ratio, Hemoglobin A1c  Essential hypertension  Pure hypercholesterolemia   Plan:   Chronic, hopefully stable. Failed metformin . If not controlled, will consider Farxiga  or Jardiance depending on ins. Counseled on diet and exercise. Chronic, stable.  Continue Exforge  5-320 mg  daily. Chronic, stable.  Continue Crestor  20 mg daily. F/u in 3-6 mo. The patient voiced understanding and agreement to the plan.  Mabel Mt [place], DO 03/27/24 10:42 AM

## 2024-03-27 NOTE — Patient Instructions (Signed)
 Give [place] 2-3 business days to get the results of your labs back.   Keep the diet clean and stay active.  Let us know if you need anything.

## 2024-07-03 ENCOUNTER — Ambulatory Visit: Payer: Self-pay | Admitting: Family Medicine

## 2024-07-03 ENCOUNTER — Encounter: Payer: Self-pay | Admitting: Family Medicine

## 2024-07-03 ENCOUNTER — Ambulatory Visit: Admitting: Family Medicine

## 2024-07-03 VITALS — BP 132/80 | HR 83 | Temp 98.0°F | Resp 16 | Ht 68.0 in | Wt 201.4 lb

## 2024-07-03 DIAGNOSIS — Z23 Encounter for immunization: Secondary | ICD-10-CM | POA: Diagnosis not present

## 2024-07-03 DIAGNOSIS — E1165 Type 2 diabetes mellitus with hyperglycemia: Secondary | ICD-10-CM

## 2024-07-03 DIAGNOSIS — Z7984 Long term (current) use of oral hypoglycemic drugs: Secondary | ICD-10-CM

## 2024-07-03 LAB — HEMOGLOBIN A1C: Hgb A1c MFr Bld: 7.1 % — ABNORMAL HIGH (ref 4.6–6.5)

## 2024-07-03 MED ORDER — DAPAGLIFLOZIN PROPANEDIOL 10 MG PO TABS: 10.0000 mg | ORAL_TABLET | Freq: Every day | ORAL | 3 refills | Status: AC

## 2024-07-03 NOTE — Progress Notes (Signed)
 Subjective:   Chief Complaint  Patient presents with   Blood Sugar Problem    Blood Sugar     Ala L Larch is a 63 y.o. male here for follow-up of diabetes.   Danny Vincent does not routinely ck his sugars.  Patient does not require insulin.   Medications include: Farxiga  10 mg/d was sent but he never took- unclear why.  Diet is OK.  Exercise: walking No CP or SOB.   Past Medical History:  Diagnosis Date   Hyperlipidemia    Hypertension    Prostate cancer (HCC)    Wears glasses      Related testing: Retinal exam: Done Pneumovax: done  Objective:  BP 132/80 (BP Location: Left Arm, Patient Position: Sitting)   Pulse 83   Temp 98 F (36.7 C) (Oral)   Resp 16   Ht 5' 8 (1.727 m)   Wt 201 lb 6.4 oz (91.4 kg)   SpO2 98%   BMI 30.62 kg/m  General:  Well developed, well nourished, in no apparent distress Lungs:  CTAB, no access msc use Cardio:  RRR, no bruits, no LE edema Psych: Age appropriate judgment and insight  Assessment:   Type 2 diabetes mellitus with hyperglycemia, without long-term current use of insulin (HCC)   Plan:   Chronic, probably not controlled. Start Farxiga  10 mg/d. I want his A1c less than 7. Will see what ins will cover. Counseled on diet and exercise. F/u in 3-6 mo. The patient voiced understanding and agreement to the plan.  Mabel Mt [place], DO 07/03/24 10:06 AM

## 2024-07-03 NOTE — Patient Instructions (Signed)
 Let me know if there are issues with the oral medication.   Give us  2-3 business days to get the results of your labs back.   Let us  know if you need anything.

## 2024-07-03 NOTE — Addendum Note (Signed)
 Addended by: Cherylin Waguespack M on: 07/03/2024 10:27 AM   Modules accepted: Orders

## 2024-09-04 ENCOUNTER — Ambulatory Visit: Admitting: Family Medicine

## 2024-09-04 ENCOUNTER — Encounter: Payer: Self-pay | Admitting: Family Medicine

## 2024-09-04 VITALS — BP 132/82 | HR 68 | Temp 98.0°F | Resp 16 | Ht 68.0 in | Wt 207.6 lb

## 2024-09-04 DIAGNOSIS — I10 Essential (primary) hypertension: Secondary | ICD-10-CM

## 2024-09-04 DIAGNOSIS — Z7984 Long term (current) use of oral hypoglycemic drugs: Secondary | ICD-10-CM | POA: Diagnosis not present

## 2024-09-04 DIAGNOSIS — E1165 Type 2 diabetes mellitus with hyperglycemia: Secondary | ICD-10-CM

## 2024-09-04 MED ORDER — METFORMIN HCL ER 500 MG PO TB24: 1000.0000 mg | ORAL_TABLET | Freq: Every day | ORAL | 3 refills | Status: AC

## 2024-09-04 NOTE — Patient Instructions (Addendum)
 Keep the diet clean and stay active.  Take 1 tab daily for the first week and then take 2 tabs daily. Send me a message if there are any issues with the metformin .   Let us  know if you need anything.

## 2024-09-04 NOTE — Progress Notes (Signed)
 Subjective:   Chief Complaint  Patient presents with   Diabetes    Diabetes Check    Danny Vincent is a 63 y.o. male here for follow-up of diabetes.   He does not routinely ck his sugars.  Patient does not require insulin.   Medications include: none; ins would not cover Farxiga  Diet is OK.  Exercise: wt lifting, elliptical machine, cycling, walking.  Hypertension Patient presents for hypertension follow up. He does not monitor home blood pressures. He is compliant with medications- Exforge  5-320 mg/d. Patient has these side effects of medication: none Diet/exercise as above. No CP or SOB.   Past Medical History:  Diagnosis Date   Hyperlipidemia    Hypertension    Prostate cancer (HCC)    Wears glasses      Related testing: Retinal exam: Done Pneumovax: done  Objective:  BP 132/82 (BP Location: Left Arm, Patient Position: Sitting)   Pulse 68   Temp 98 F (36.7 C) (Oral)   Resp 16   Ht 5' 8 (1.727 m)   Wt 207 lb 9.6 oz (94.2 kg)   SpO2 96%   BMI 31.57 kg/m  General:  Well developed, well nourished, in no apparent distress Lungs:  CTAB, no access msc use Cardio:  RRR, no bruits, no LE edema Psych: Age appropriate judgment and insight  Assessment:   Type 2 diabetes mellitus with hyperglycemia, without long-term current use of insulin (HCC)  Essential hypertension   Plan:   Chronic, not stable.  He was not able to fill Farxiga  due to cost.  Will send in metformin  XR 500 mg daily for 1 week and then 1000 mg daily.  Counseled on diet and exercise. Chronic, stable.  Continue Exforge  5-320 mg daily. F/u in 3 mo. The patient voiced understanding and agreement to the plan.  Mabel Mt [place], DO 09/04/2024 10:09 AM

## 2024-09-26 ENCOUNTER — Other Ambulatory Visit: Payer: Self-pay

## 2024-09-26 DIAGNOSIS — I10 Essential (primary) hypertension: Secondary | ICD-10-CM

## 2024-09-26 MED ORDER — ROSUVASTATIN CALCIUM 20 MG PO TABS: ORAL_TABLET | ORAL | 3 refills | Status: AC

## 2024-12-04 ENCOUNTER — Ambulatory Visit: Admitting: Family Medicine
